# Patient Record
Sex: Male | Born: 1935 | Race: White | Hispanic: No | Marital: Married | Smoking: Never smoker
Health system: Southern US, Community
[De-identification: ages and names within clinical notes are randomized; demographics above are authoritative.]

## PROBLEM LIST (undated history)

## (undated) DIAGNOSIS — M4800 Spinal stenosis, site unspecified: Secondary | ICD-10-CM

## (undated) DIAGNOSIS — I10 Essential (primary) hypertension: Secondary | ICD-10-CM

## (undated) DIAGNOSIS — S129XXA Fracture of neck, unspecified, initial encounter: Secondary | ICD-10-CM

## (undated) DIAGNOSIS — I493 Ventricular premature depolarization: Secondary | ICD-10-CM

## (undated) DIAGNOSIS — K589 Irritable bowel syndrome without diarrhea: Secondary | ICD-10-CM

## (undated) DIAGNOSIS — I480 Paroxysmal atrial fibrillation: Secondary | ICD-10-CM

## (undated) DIAGNOSIS — I495 Sick sinus syndrome: Secondary | ICD-10-CM

## (undated) DIAGNOSIS — R079 Chest pain, unspecified: Secondary | ICD-10-CM

## (undated) DIAGNOSIS — I351 Nonrheumatic aortic (valve) insufficiency: Secondary | ICD-10-CM

## (undated) DIAGNOSIS — I340 Nonrheumatic mitral (valve) insufficiency: Secondary | ICD-10-CM

## (undated) DIAGNOSIS — I4891 Unspecified atrial fibrillation: Secondary | ICD-10-CM

## (undated) DIAGNOSIS — E782 Mixed hyperlipidemia: Secondary | ICD-10-CM

## (undated) DIAGNOSIS — K635 Polyp of colon: Secondary | ICD-10-CM

## (undated) DIAGNOSIS — F419 Anxiety disorder, unspecified: Secondary | ICD-10-CM

## (undated) DIAGNOSIS — H9319 Tinnitus, unspecified ear: Secondary | ICD-10-CM

## (undated) HISTORY — DX: Irritable bowel syndrome, unspecified: K58.9

## (undated) HISTORY — DX: Mixed hyperlipidemia: E78.2

## (undated) HISTORY — PX: COLONOSCOPY: SHX174

## (undated) HISTORY — DX: Nonrheumatic mitral (valve) insufficiency: I34.0

## (undated) HISTORY — DX: Ventricular premature depolarization: I49.3

## (undated) HISTORY — DX: Fracture of neck, unspecified, initial encounter: S12.9XXA

## (undated) HISTORY — PX: HERNIA REPAIR: SHX51

## (undated) HISTORY — DX: Spinal stenosis, site unspecified: M48.00

## (undated) HISTORY — DX: Chest pain, unspecified: R07.9

## (undated) HISTORY — DX: Unspecified atrial fibrillation: I48.91

## (undated) HISTORY — DX: Polyp of colon: K63.5

## (undated) HISTORY — DX: Sick sinus syndrome: I49.5

## (undated) HISTORY — DX: Paroxysmal atrial fibrillation: I48.0

## (undated) HISTORY — DX: Nonrheumatic aortic (valve) insufficiency: I35.1

## (undated) HISTORY — DX: Essential (primary) hypertension: I10

## (undated) HISTORY — DX: Tinnitus, unspecified ear: H93.19

## (undated) HISTORY — DX: Anxiety disorder, unspecified: F41.9

---

## 2003-11-19 ENCOUNTER — Ambulatory Visit: Payer: Self-pay | Admitting: Family Medicine

## 2003-11-26 ENCOUNTER — Ambulatory Visit: Payer: Self-pay | Admitting: Family Medicine

## 2003-12-03 ENCOUNTER — Ambulatory Visit: Payer: Self-pay | Admitting: Family Medicine

## 2003-12-16 ENCOUNTER — Ambulatory Visit: Payer: Self-pay | Admitting: Family Medicine

## 2003-12-29 ENCOUNTER — Ambulatory Visit: Payer: Self-pay | Admitting: Family Medicine

## 2004-01-01 ENCOUNTER — Ambulatory Visit: Payer: Self-pay | Admitting: Cardiology

## 2004-01-01 ENCOUNTER — Ambulatory Visit: Payer: Self-pay

## 2004-01-04 ENCOUNTER — Ambulatory Visit: Payer: Self-pay | Admitting: Family Medicine

## 2004-01-13 ENCOUNTER — Ambulatory Visit: Payer: Self-pay | Admitting: Family Medicine

## 2004-01-21 ENCOUNTER — Ambulatory Visit: Payer: Self-pay | Admitting: Family Medicine

## 2004-01-28 ENCOUNTER — Ambulatory Visit: Payer: Self-pay | Admitting: Family Medicine

## 2004-02-05 ENCOUNTER — Ambulatory Visit (HOSPITAL_COMMUNITY): Admission: RE | Admit: 2004-02-05 | Discharge: 2004-02-05 | Payer: Self-pay | Admitting: Cardiology

## 2004-02-08 ENCOUNTER — Ambulatory Visit: Payer: Self-pay | Admitting: Family Medicine

## 2004-02-11 ENCOUNTER — Ambulatory Visit: Payer: Self-pay | Admitting: Cardiology

## 2004-02-12 ENCOUNTER — Ambulatory Visit (HOSPITAL_COMMUNITY): Admission: RE | Admit: 2004-02-12 | Discharge: 2004-02-12 | Payer: Self-pay | Admitting: Cardiology

## 2004-02-15 ENCOUNTER — Ambulatory Visit: Payer: Self-pay | Admitting: Family Medicine

## 2004-02-18 ENCOUNTER — Ambulatory Visit: Payer: Self-pay | Admitting: Family Medicine

## 2004-02-19 ENCOUNTER — Ambulatory Visit: Payer: Self-pay

## 2004-02-23 ENCOUNTER — Ambulatory Visit: Payer: Self-pay | Admitting: Cardiology

## 2004-02-26 ENCOUNTER — Ambulatory Visit (HOSPITAL_COMMUNITY): Admission: RE | Admit: 2004-02-26 | Discharge: 2004-02-26 | Payer: Self-pay | Admitting: Cardiology

## 2004-02-26 ENCOUNTER — Ambulatory Visit: Payer: Self-pay | Admitting: Cardiology

## 2004-02-29 ENCOUNTER — Ambulatory Visit: Payer: Self-pay | Admitting: Family Medicine

## 2004-03-03 ENCOUNTER — Ambulatory Visit: Payer: Self-pay | Admitting: Family Medicine

## 2004-03-07 ENCOUNTER — Ambulatory Visit: Payer: Self-pay | Admitting: Family Medicine

## 2004-03-11 ENCOUNTER — Ambulatory Visit: Payer: Self-pay | Admitting: Family Medicine

## 2004-03-14 ENCOUNTER — Ambulatory Visit: Payer: Self-pay | Admitting: Family Medicine

## 2004-03-17 ENCOUNTER — Ambulatory Visit: Payer: Self-pay | Admitting: Family Medicine

## 2004-03-21 ENCOUNTER — Ambulatory Visit: Payer: Self-pay | Admitting: Family Medicine

## 2004-03-25 ENCOUNTER — Ambulatory Visit: Payer: Self-pay | Admitting: Family Medicine

## 2004-03-28 ENCOUNTER — Ambulatory Visit: Payer: Self-pay | Admitting: Cardiology

## 2004-03-31 ENCOUNTER — Ambulatory Visit: Payer: Self-pay | Admitting: Family Medicine

## 2004-04-04 ENCOUNTER — Ambulatory Visit: Payer: Self-pay | Admitting: Family Medicine

## 2004-04-07 ENCOUNTER — Ambulatory Visit: Payer: Self-pay | Admitting: Cardiology

## 2004-04-07 ENCOUNTER — Ambulatory Visit: Payer: Self-pay

## 2004-04-11 ENCOUNTER — Ambulatory Visit: Payer: Self-pay | Admitting: Family Medicine

## 2004-04-15 ENCOUNTER — Ambulatory Visit: Payer: Self-pay | Admitting: Family Medicine

## 2004-04-18 ENCOUNTER — Ambulatory Visit: Payer: Self-pay | Admitting: Family Medicine

## 2004-04-28 ENCOUNTER — Ambulatory Visit: Payer: Self-pay | Admitting: Family Medicine

## 2004-05-04 ENCOUNTER — Ambulatory Visit: Payer: Self-pay | Admitting: Family Medicine

## 2004-05-11 ENCOUNTER — Ambulatory Visit: Payer: Self-pay | Admitting: Family Medicine

## 2004-05-27 ENCOUNTER — Ambulatory Visit: Payer: Self-pay | Admitting: Family Medicine

## 2004-06-09 ENCOUNTER — Ambulatory Visit: Payer: Self-pay | Admitting: Family Medicine

## 2004-06-27 ENCOUNTER — Ambulatory Visit: Payer: Self-pay | Admitting: Family Medicine

## 2004-07-04 ENCOUNTER — Ambulatory Visit: Payer: Self-pay | Admitting: Cardiology

## 2004-10-27 ENCOUNTER — Ambulatory Visit: Payer: Self-pay | Admitting: Family Medicine

## 2004-12-06 ENCOUNTER — Ambulatory Visit: Payer: Self-pay | Admitting: Cardiology

## 2005-01-27 ENCOUNTER — Ambulatory Visit: Payer: Self-pay | Admitting: Family Medicine

## 2005-02-23 ENCOUNTER — Ambulatory Visit: Admission: RE | Admit: 2005-02-23 | Discharge: 2005-02-23 | Payer: Self-pay | Admitting: Cardiology

## 2008-09-22 ENCOUNTER — Telehealth (INDEPENDENT_AMBULATORY_CARE_PROVIDER_SITE_OTHER): Payer: Self-pay | Admitting: *Deleted

## 2008-09-23 ENCOUNTER — Ambulatory Visit: Payer: Self-pay | Admitting: Cardiovascular Disease

## 2010-02-21 ENCOUNTER — Ambulatory Visit (INDEPENDENT_AMBULATORY_CARE_PROVIDER_SITE_OTHER): Payer: Medicare Other | Admitting: Cardiovascular Disease

## 2010-02-21 DIAGNOSIS — I499 Cardiac arrhythmia, unspecified: Secondary | ICD-10-CM

## 2010-05-23 ENCOUNTER — Ambulatory Visit: Payer: Medicare Other | Admitting: Cardiovascular Disease

## 2010-05-31 NOTE — Letter (Signed)
September 23, 2008    Elease Hashimoto A. Benedetto Goad, M.D.  [address]  [place]  [place] [address]   RE:  [name], [name]  MRN:  [id number]  /  DOB:  [birth year]   Dear Dr. Benedetto Goad:   I am writing to you regarding your patient, Keith Mccall, whom I saw in  Cardiology Clinic today.  As you know, he is a 77 year old white male  with a history of atrial fibrillation.  He presented to my office today  in order to establish cardiovascular care as his prior cardiologist, Dr.  Shelva Majestic, has relocated.  Keith Mccall is experiencing very infrequent  palpitations and it is not entirely clear whether these are secondary to  atrial fibrillation or simply premature atrial or ventricular  contractions.  I suggested that he wear a cardiac event monitor at home  in order to assess whether he actually is having episodes of atrial  fibrillation, however, the patient declines this at this point secondary  to issues with anxiety.  He is willing to undergo a treadmill EKG which  we will arrange in order to see if exercise elicits any atrial  fibrillation.  He is in normal sinus rhythm today with a normal  cardiovascular exam.  From a medication standpoint, we did recommend  that he reinstitute therapy with aspirin on a daily basis as if he is  having any episodes of atrial fibrillation this will decrease in his  risk for stroke.   I hope you agree with this approach and I look forward to following this  gentleman along with you.  Please feel free to contact our office at any  time.   Sincerely,    Sincerely,      Brayton El, MD  Electronically Signed    SGA/MedQ  DD: 09/23/2008  DT: 09/23/2008  Job #: [id number]

## 2010-05-31 NOTE — Assessment & Plan Note (Signed)
[facility]                        [place] CARDIOLOGY OFFICE NOTE   Keith Mccall, Keith Mccall                        MRN:          [id number]  DATE:09/23/2008                            DOB:          12-15-1930    CHIEF COMPLAINT:  Establishing cardiovascular care.   HISTORY OF PRESENT ILLNESS:  Keith Mccall is a 77 year old white male,  past medical history significant for atrial fibrillation and anxiety who  is presenting to establish cardiovascular care.  The patient states he  has been followed by Dr. Benedetto Goad, his primary care physician, and Dr.  Shelva Majestic, his previous cardiologist.  However, Dr. Shelva Majestic recently moved  out of [place].  The patient states that his cardiovascular history  only includes a couple of episodes of atrial fibrillation.  The first of  which was in 2005, at which time, he was admitted to [facility]  and spontaneously converted to normal sinus rhythm.  The patient had  return of his atrial fibrillation within the next year, at which time he  was started on amiodarone and cardioverted back to normal sinus rhythm.  The patient was on amiodarone for several years at a very low dose.  However, this medication was stopped in the past year as the patient's  ophthalmologist told him that he was beginning to see crystals during  his opthomologic examform and recommended that this medication be  discontinued.  The patient states that he gets palpitations, but very  infrequently.  He states he has not had any at all in the past 3 weeks.  When he does experience palpitations they last less than half an hour  and are self-limiting.  He checks his pulse and his blood pressure at  home occasionally.  He denies any dizziness or syncopal episodes.  He  denies any episodes of chest discomfort.  His and his wife occasionally  takes walk, but have been doing so less frequently secondary to the  heat.  He is able to walk 20-25 minutes with only  mild shortness of  breath.  He states the dyspnea has been present for the past several  years without change.  He denies any lower extremity edema, PND, or  orthopnea.   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  He saw Allena Napoleon, an integrative medicine doctor  who performed chelation therapy on him for this.  She also started him  on variety of supplements.   FAMILY HISTORY:  Positive for extensive coronary artery disease.   SOCIAL HISTORY:  No alcohol.  No tobacco.   ALLERGIES:  PENICILLIN and SULFA both cause hives.   MEDICATIONS:  1. Metoprolol 12.5 mg p.o. b.i.d.  2. Magnesium 200 mg 3 tablets daily.  3. Vitamin D3, 5000 international units daily.  4. Taurine 2000 mg daily.  5. Hawthorn extract 500 mg every evening.  6. Ubiquinone 50 mg every day.  7. Probiotics, Dairy Digest.  8. Veggie enzymes.  9. High Potency Omega-3  10.Fitness tablets.  11.Nature's pearl muscadine grape seed supplement.  12.The patient also takes Xanax p.r.n.   REVIEW OF SYSTEMS:  As in HPI, in addition, the patient states he  suffers from anxiety.  He tends to have ruminative thoughts and states  that it is very easy to make him feel concerned about issues that are  not necessarily objectively concerning.  Other systems reviewed as in  HPI, otherwise negative.  The patient complains of a long-term history  of tinnitus.   PHYSICAL EXAMINATION:  VITAL SIGNS:  Blood pressure is 155/89, pulse 73,  weight 241 pounds, and sating 95% on room air.  GENERAL:  He is in no acute distress.  HEENT:  Nonfocal.  Cranial nerves II through XII grossly intact.  NECK:  Supple.  There is no JVD.  There is no carotid bruit.  There is  no lymphadenopathy.  HEART:  Regular rate and rhythm without murmur, rub, or gallop.  LUNGS:  Clear to auscultation bilaterally.  ABDOMEN:  Soft, nontender,  and nondistended.  There are no bruits  audible.  EXTREMITIES:  Without edema.  SKIN:  Warm and dry without rash.  Pulses 2+ bilateral.  Carotid and  radial pulses is palpable bilateral posterior tibial pulses.  NEUROLOGIC:  Nonfocal.  PSYCHIATRIC:  The patient appropriate with normal levels of insight.  MUSCULOSKELETAL:  The patient has 5/5 bilateral upper and lower  extremities strength.   LABORATORY DATA:  EKG from today independently interrupted by myself has  baseline artefact; however, it is normal sinus rhythm with PR interval  of 176 milliseconds and QRS duration of 102 milliseconds, his QTc is  455.  Review of the patient's medical records shows an echo dated September 04, 2007, showed some mild mitral annular calcification and mild mitral  regurgitation.  His ejection fraction was 60%.  His left atrial size was  4.9 cm.   ASSESSMENT:  A 77 year old white male with a history of atrial  fibrillation, who is currently in normal sinus rhythm and not having any  symptoms consistent with atrial fibrillation.  It should be noted that  in the past during all of his doctor visits over the past couple of  years, he has been in normal sinus rhythm.  He was told by his previous  cardiologist that he does not believe that he is experiencing further  episodes of atrial fibrillation.  The patient is currently not taking  aspirin; however, he is not sure why this was stopped.  His wife seems  to think that he may have had some stomach problems in the past, but he  specifically denies any peptic ulcer disease.  The patient is very  anxious about potential of wearing a heart monitor at home to assess for  atrial fibrillation.   PLAN:  We will order a treadmill EKG as the patient does state he gets  some shortness of breath when walking to see if exercise elicits atrial  fibrillation.  The patient is currently declining a cardiac event  monitor at home.  However, we do recommended that the  patient restart  aspirin on a daily basis.  He seems willing to try this.  Regarding the  multiple supplements that he is taking, I told him that the fish oil,  the muscadine grape seed extract, and the multivitamin he is taking are  probably reasonable supplements.  We will leave the continuation of the  others to his discretion; however, we cannot recommend them.  The  patient understands that  he may be experiencing atrial fibrillation  intermittently and that these episodes maybe completely asymptomatic.  It is also possible that some of these episodes may be associated with  rapid ventricular response that may over time lead to worsening of his  ejection fraction.  Unfortunately, the patient is not currently willing  to wear cardiac monitor.  He is instructed to contact our office if he  experiences return of persistent palpitations.  Otherwise, he should  continue on his metoprolol dose above.  We will see him back in clinic  in 3 months' time.     Brayton El, MD  Electronically Signed    SGA/MedQ  DD: 09/23/2008  DT: 09/23/2008  Job #: [id number]

## 2010-09-07 ENCOUNTER — Encounter: Payer: Self-pay | Admitting: Cardiovascular Disease

## 2010-09-15 ENCOUNTER — Encounter: Payer: Self-pay | Admitting: Cardiovascular Disease

## 2011-01-19 DIAGNOSIS — IMO0002 Reserved for concepts with insufficient information to code with codable children: Secondary | ICD-10-CM | POA: Diagnosis not present

## 2011-01-19 DIAGNOSIS — M999 Biomechanical lesion, unspecified: Secondary | ICD-10-CM | POA: Diagnosis not present

## 2011-01-19 DIAGNOSIS — M9981 Other biomechanical lesions of cervical region: Secondary | ICD-10-CM | POA: Diagnosis not present

## 2011-01-31 DIAGNOSIS — IMO0002 Reserved for concepts with insufficient information to code with codable children: Secondary | ICD-10-CM | POA: Diagnosis not present

## 2011-01-31 DIAGNOSIS — M9981 Other biomechanical lesions of cervical region: Secondary | ICD-10-CM | POA: Diagnosis not present

## 2011-01-31 DIAGNOSIS — M999 Biomechanical lesion, unspecified: Secondary | ICD-10-CM | POA: Diagnosis not present

## 2011-02-18 DIAGNOSIS — IMO0002 Reserved for concepts with insufficient information to code with codable children: Secondary | ICD-10-CM | POA: Diagnosis not present

## 2011-02-18 DIAGNOSIS — M9981 Other biomechanical lesions of cervical region: Secondary | ICD-10-CM | POA: Diagnosis not present

## 2011-02-18 DIAGNOSIS — M999 Biomechanical lesion, unspecified: Secondary | ICD-10-CM | POA: Diagnosis not present

## 2011-02-23 DIAGNOSIS — M999 Biomechanical lesion, unspecified: Secondary | ICD-10-CM | POA: Diagnosis not present

## 2011-02-23 DIAGNOSIS — IMO0002 Reserved for concepts with insufficient information to code with codable children: Secondary | ICD-10-CM | POA: Diagnosis not present

## 2011-02-23 DIAGNOSIS — M9981 Other biomechanical lesions of cervical region: Secondary | ICD-10-CM | POA: Diagnosis not present

## 2011-03-09 DIAGNOSIS — IMO0002 Reserved for concepts with insufficient information to code with codable children: Secondary | ICD-10-CM | POA: Diagnosis not present

## 2011-03-09 DIAGNOSIS — M9981 Other biomechanical lesions of cervical region: Secondary | ICD-10-CM | POA: Diagnosis not present

## 2011-03-09 DIAGNOSIS — M999 Biomechanical lesion, unspecified: Secondary | ICD-10-CM | POA: Diagnosis not present

## 2011-03-21 DIAGNOSIS — M999 Biomechanical lesion, unspecified: Secondary | ICD-10-CM | POA: Diagnosis not present

## 2011-03-21 DIAGNOSIS — M9981 Other biomechanical lesions of cervical region: Secondary | ICD-10-CM | POA: Diagnosis not present

## 2011-03-21 DIAGNOSIS — IMO0002 Reserved for concepts with insufficient information to code with codable children: Secondary | ICD-10-CM | POA: Diagnosis not present

## 2011-03-30 DIAGNOSIS — M9981 Other biomechanical lesions of cervical region: Secondary | ICD-10-CM | POA: Diagnosis not present

## 2011-03-30 DIAGNOSIS — M999 Biomechanical lesion, unspecified: Secondary | ICD-10-CM | POA: Diagnosis not present

## 2011-03-30 DIAGNOSIS — IMO0002 Reserved for concepts with insufficient information to code with codable children: Secondary | ICD-10-CM | POA: Diagnosis not present

## 2011-04-11 DIAGNOSIS — M999 Biomechanical lesion, unspecified: Secondary | ICD-10-CM | POA: Diagnosis not present

## 2011-04-11 DIAGNOSIS — IMO0002 Reserved for concepts with insufficient information to code with codable children: Secondary | ICD-10-CM | POA: Diagnosis not present

## 2011-04-11 DIAGNOSIS — M9981 Other biomechanical lesions of cervical region: Secondary | ICD-10-CM | POA: Diagnosis not present

## 2011-04-20 DIAGNOSIS — M9981 Other biomechanical lesions of cervical region: Secondary | ICD-10-CM | POA: Diagnosis not present

## 2011-04-20 DIAGNOSIS — IMO0002 Reserved for concepts with insufficient information to code with codable children: Secondary | ICD-10-CM | POA: Diagnosis not present

## 2011-04-20 DIAGNOSIS — M999 Biomechanical lesion, unspecified: Secondary | ICD-10-CM | POA: Diagnosis not present

## 2011-04-22 DIAGNOSIS — M9981 Other biomechanical lesions of cervical region: Secondary | ICD-10-CM | POA: Diagnosis not present

## 2011-04-22 DIAGNOSIS — M999 Biomechanical lesion, unspecified: Secondary | ICD-10-CM | POA: Diagnosis not present

## 2011-04-22 DIAGNOSIS — IMO0002 Reserved for concepts with insufficient information to code with codable children: Secondary | ICD-10-CM | POA: Diagnosis not present

## 2011-04-24 DIAGNOSIS — E782 Mixed hyperlipidemia: Secondary | ICD-10-CM | POA: Diagnosis not present

## 2011-04-24 DIAGNOSIS — I10 Essential (primary) hypertension: Secondary | ICD-10-CM | POA: Diagnosis not present

## 2011-04-24 DIAGNOSIS — Z79899 Other long term (current) drug therapy: Secondary | ICD-10-CM | POA: Diagnosis not present

## 2011-04-27 DIAGNOSIS — IMO0002 Reserved for concepts with insufficient information to code with codable children: Secondary | ICD-10-CM | POA: Diagnosis not present

## 2011-04-27 DIAGNOSIS — M9981 Other biomechanical lesions of cervical region: Secondary | ICD-10-CM | POA: Diagnosis not present

## 2011-04-27 DIAGNOSIS — M999 Biomechanical lesion, unspecified: Secondary | ICD-10-CM | POA: Diagnosis not present

## 2011-05-04 DIAGNOSIS — H2589 Other age-related cataract: Secondary | ICD-10-CM | POA: Diagnosis not present

## 2011-05-04 DIAGNOSIS — IMO0002 Reserved for concepts with insufficient information to code with codable children: Secondary | ICD-10-CM | POA: Diagnosis not present

## 2011-05-04 DIAGNOSIS — M9981 Other biomechanical lesions of cervical region: Secondary | ICD-10-CM | POA: Diagnosis not present

## 2011-05-04 DIAGNOSIS — M999 Biomechanical lesion, unspecified: Secondary | ICD-10-CM | POA: Diagnosis not present

## 2011-05-09 DIAGNOSIS — IMO0002 Reserved for concepts with insufficient information to code with codable children: Secondary | ICD-10-CM | POA: Diagnosis not present

## 2011-05-09 DIAGNOSIS — M9981 Other biomechanical lesions of cervical region: Secondary | ICD-10-CM | POA: Diagnosis not present

## 2011-05-09 DIAGNOSIS — M999 Biomechanical lesion, unspecified: Secondary | ICD-10-CM | POA: Diagnosis not present

## 2011-05-18 DIAGNOSIS — M999 Biomechanical lesion, unspecified: Secondary | ICD-10-CM | POA: Diagnosis not present

## 2011-05-18 DIAGNOSIS — M9981 Other biomechanical lesions of cervical region: Secondary | ICD-10-CM | POA: Diagnosis not present

## 2011-05-18 DIAGNOSIS — IMO0002 Reserved for concepts with insufficient information to code with codable children: Secondary | ICD-10-CM | POA: Diagnosis not present

## 2011-05-25 DIAGNOSIS — IMO0002 Reserved for concepts with insufficient information to code with codable children: Secondary | ICD-10-CM | POA: Diagnosis not present

## 2011-05-25 DIAGNOSIS — M9981 Other biomechanical lesions of cervical region: Secondary | ICD-10-CM | POA: Diagnosis not present

## 2011-05-25 DIAGNOSIS — M999 Biomechanical lesion, unspecified: Secondary | ICD-10-CM | POA: Diagnosis not present

## 2011-06-06 DIAGNOSIS — M999 Biomechanical lesion, unspecified: Secondary | ICD-10-CM | POA: Diagnosis not present

## 2011-06-06 DIAGNOSIS — M9981 Other biomechanical lesions of cervical region: Secondary | ICD-10-CM | POA: Diagnosis not present

## 2011-06-06 DIAGNOSIS — IMO0002 Reserved for concepts with insufficient information to code with codable children: Secondary | ICD-10-CM | POA: Diagnosis not present

## 2011-06-15 DIAGNOSIS — M999 Biomechanical lesion, unspecified: Secondary | ICD-10-CM | POA: Diagnosis not present

## 2011-06-15 DIAGNOSIS — IMO0002 Reserved for concepts with insufficient information to code with codable children: Secondary | ICD-10-CM | POA: Diagnosis not present

## 2011-06-15 DIAGNOSIS — M9981 Other biomechanical lesions of cervical region: Secondary | ICD-10-CM | POA: Diagnosis not present

## 2011-06-27 DIAGNOSIS — M999 Biomechanical lesion, unspecified: Secondary | ICD-10-CM | POA: Diagnosis not present

## 2011-06-27 DIAGNOSIS — IMO0002 Reserved for concepts with insufficient information to code with codable children: Secondary | ICD-10-CM | POA: Diagnosis not present

## 2011-06-27 DIAGNOSIS — M9981 Other biomechanical lesions of cervical region: Secondary | ICD-10-CM | POA: Diagnosis not present

## 2011-07-04 DIAGNOSIS — M999 Biomechanical lesion, unspecified: Secondary | ICD-10-CM | POA: Diagnosis not present

## 2011-07-04 DIAGNOSIS — M9981 Other biomechanical lesions of cervical region: Secondary | ICD-10-CM | POA: Diagnosis not present

## 2011-07-04 DIAGNOSIS — IMO0002 Reserved for concepts with insufficient information to code with codable children: Secondary | ICD-10-CM | POA: Diagnosis not present

## 2011-07-06 DIAGNOSIS — K589 Irritable bowel syndrome without diarrhea: Secondary | ICD-10-CM | POA: Diagnosis not present

## 2011-07-06 DIAGNOSIS — F411 Generalized anxiety disorder: Secondary | ICD-10-CM | POA: Diagnosis not present

## 2011-08-16 DIAGNOSIS — I4891 Unspecified atrial fibrillation: Secondary | ICD-10-CM | POA: Diagnosis not present

## 2011-08-16 DIAGNOSIS — I10 Essential (primary) hypertension: Secondary | ICD-10-CM | POA: Diagnosis not present

## 2011-08-16 DIAGNOSIS — E782 Mixed hyperlipidemia: Secondary | ICD-10-CM | POA: Diagnosis not present

## 2011-09-12 DIAGNOSIS — F411 Generalized anxiety disorder: Secondary | ICD-10-CM | POA: Diagnosis not present

## 2011-09-12 DIAGNOSIS — E119 Type 2 diabetes mellitus without complications: Secondary | ICD-10-CM | POA: Diagnosis not present

## 2011-09-12 DIAGNOSIS — I10 Essential (primary) hypertension: Secondary | ICD-10-CM | POA: Diagnosis not present

## 2011-12-21 DIAGNOSIS — Z1211 Encounter for screening for malignant neoplasm of colon: Secondary | ICD-10-CM | POA: Diagnosis not present

## 2012-01-03 DIAGNOSIS — E119 Type 2 diabetes mellitus without complications: Secondary | ICD-10-CM | POA: Diagnosis not present

## 2012-01-03 DIAGNOSIS — I10 Essential (primary) hypertension: Secondary | ICD-10-CM | POA: Diagnosis not present

## 2012-01-03 DIAGNOSIS — R634 Abnormal weight loss: Secondary | ICD-10-CM | POA: Diagnosis not present

## 2012-01-03 DIAGNOSIS — K589 Irritable bowel syndrome without diarrhea: Secondary | ICD-10-CM | POA: Diagnosis not present

## 2012-01-03 DIAGNOSIS — F411 Generalized anxiety disorder: Secondary | ICD-10-CM | POA: Diagnosis not present

## 2012-01-30 DIAGNOSIS — M9981 Other biomechanical lesions of cervical region: Secondary | ICD-10-CM | POA: Diagnosis not present

## 2012-01-30 DIAGNOSIS — M999 Biomechanical lesion, unspecified: Secondary | ICD-10-CM | POA: Diagnosis not present

## 2012-01-30 DIAGNOSIS — IMO0002 Reserved for concepts with insufficient information to code with codable children: Secondary | ICD-10-CM | POA: Diagnosis not present

## 2012-02-03 DIAGNOSIS — M9981 Other biomechanical lesions of cervical region: Secondary | ICD-10-CM | POA: Diagnosis not present

## 2012-02-03 DIAGNOSIS — IMO0002 Reserved for concepts with insufficient information to code with codable children: Secondary | ICD-10-CM | POA: Diagnosis not present

## 2012-02-03 DIAGNOSIS — M999 Biomechanical lesion, unspecified: Secondary | ICD-10-CM | POA: Diagnosis not present

## 2012-03-07 DIAGNOSIS — IMO0002 Reserved for concepts with insufficient information to code with codable children: Secondary | ICD-10-CM | POA: Diagnosis not present

## 2012-03-07 DIAGNOSIS — M999 Biomechanical lesion, unspecified: Secondary | ICD-10-CM | POA: Diagnosis not present

## 2012-03-19 DIAGNOSIS — M999 Biomechanical lesion, unspecified: Secondary | ICD-10-CM | POA: Diagnosis not present

## 2012-03-19 DIAGNOSIS — IMO0002 Reserved for concepts with insufficient information to code with codable children: Secondary | ICD-10-CM | POA: Diagnosis not present

## 2012-04-06 DIAGNOSIS — M999 Biomechanical lesion, unspecified: Secondary | ICD-10-CM | POA: Diagnosis not present

## 2012-04-06 DIAGNOSIS — IMO0002 Reserved for concepts with insufficient information to code with codable children: Secondary | ICD-10-CM | POA: Diagnosis not present

## 2012-04-15 DIAGNOSIS — K140 Glossitis: Secondary | ICD-10-CM | POA: Diagnosis not present

## 2012-04-25 DIAGNOSIS — IMO0002 Reserved for concepts with insufficient information to code with codable children: Secondary | ICD-10-CM | POA: Diagnosis not present

## 2012-04-25 DIAGNOSIS — M999 Biomechanical lesion, unspecified: Secondary | ICD-10-CM | POA: Diagnosis not present

## 2012-05-02 DIAGNOSIS — IMO0002 Reserved for concepts with insufficient information to code with codable children: Secondary | ICD-10-CM | POA: Diagnosis not present

## 2012-05-02 DIAGNOSIS — M999 Biomechanical lesion, unspecified: Secondary | ICD-10-CM | POA: Diagnosis not present

## 2012-05-06 DIAGNOSIS — H2589 Other age-related cataract: Secondary | ICD-10-CM | POA: Diagnosis not present

## 2012-05-07 DIAGNOSIS — M999 Biomechanical lesion, unspecified: Secondary | ICD-10-CM | POA: Diagnosis not present

## 2012-05-07 DIAGNOSIS — IMO0002 Reserved for concepts with insufficient information to code with codable children: Secondary | ICD-10-CM | POA: Diagnosis not present

## 2012-05-16 DIAGNOSIS — IMO0002 Reserved for concepts with insufficient information to code with codable children: Secondary | ICD-10-CM | POA: Diagnosis not present

## 2012-05-16 DIAGNOSIS — M999 Biomechanical lesion, unspecified: Secondary | ICD-10-CM | POA: Diagnosis not present

## 2012-05-23 DIAGNOSIS — M999 Biomechanical lesion, unspecified: Secondary | ICD-10-CM | POA: Diagnosis not present

## 2012-05-23 DIAGNOSIS — IMO0002 Reserved for concepts with insufficient information to code with codable children: Secondary | ICD-10-CM | POA: Diagnosis not present

## 2012-05-30 DIAGNOSIS — IMO0002 Reserved for concepts with insufficient information to code with codable children: Secondary | ICD-10-CM | POA: Diagnosis not present

## 2012-05-30 DIAGNOSIS — M999 Biomechanical lesion, unspecified: Secondary | ICD-10-CM | POA: Diagnosis not present

## 2012-06-11 DIAGNOSIS — IMO0002 Reserved for concepts with insufficient information to code with codable children: Secondary | ICD-10-CM | POA: Diagnosis not present

## 2012-06-11 DIAGNOSIS — M999 Biomechanical lesion, unspecified: Secondary | ICD-10-CM | POA: Diagnosis not present

## 2012-06-14 DIAGNOSIS — R42 Dizziness and giddiness: Secondary | ICD-10-CM | POA: Diagnosis not present

## 2012-06-14 DIAGNOSIS — Z0000 Encounter for general adult medical examination without abnormal findings: Secondary | ICD-10-CM | POA: Diagnosis not present

## 2012-06-14 DIAGNOSIS — F411 Generalized anxiety disorder: Secondary | ICD-10-CM | POA: Diagnosis not present

## 2012-06-14 DIAGNOSIS — R7989 Other specified abnormal findings of blood chemistry: Secondary | ICD-10-CM | POA: Diagnosis not present

## 2012-06-18 DIAGNOSIS — M999 Biomechanical lesion, unspecified: Secondary | ICD-10-CM | POA: Diagnosis not present

## 2012-06-18 DIAGNOSIS — IMO0002 Reserved for concepts with insufficient information to code with codable children: Secondary | ICD-10-CM | POA: Diagnosis not present

## 2012-06-20 DIAGNOSIS — I6529 Occlusion and stenosis of unspecified carotid artery: Secondary | ICD-10-CM | POA: Diagnosis not present

## 2012-06-20 DIAGNOSIS — R42 Dizziness and giddiness: Secondary | ICD-10-CM | POA: Diagnosis not present

## 2012-06-20 DIAGNOSIS — I658 Occlusion and stenosis of other precerebral arteries: Secondary | ICD-10-CM | POA: Diagnosis not present

## 2012-06-20 DIAGNOSIS — I359 Nonrheumatic aortic valve disorder, unspecified: Secondary | ICD-10-CM | POA: Diagnosis not present

## 2012-07-03 DIAGNOSIS — I10 Essential (primary) hypertension: Secondary | ICD-10-CM | POA: Diagnosis not present

## 2012-07-03 DIAGNOSIS — F411 Generalized anxiety disorder: Secondary | ICD-10-CM | POA: Diagnosis not present

## 2012-07-03 DIAGNOSIS — E119 Type 2 diabetes mellitus without complications: Secondary | ICD-10-CM | POA: Diagnosis not present

## 2012-07-03 DIAGNOSIS — I672 Cerebral atherosclerosis: Secondary | ICD-10-CM | POA: Diagnosis not present

## 2012-07-04 DIAGNOSIS — M999 Biomechanical lesion, unspecified: Secondary | ICD-10-CM | POA: Diagnosis not present

## 2012-07-04 DIAGNOSIS — IMO0002 Reserved for concepts with insufficient information to code with codable children: Secondary | ICD-10-CM | POA: Diagnosis not present

## 2012-07-18 DIAGNOSIS — IMO0002 Reserved for concepts with insufficient information to code with codable children: Secondary | ICD-10-CM | POA: Diagnosis not present

## 2012-07-18 DIAGNOSIS — M999 Biomechanical lesion, unspecified: Secondary | ICD-10-CM | POA: Diagnosis not present

## 2012-07-25 DIAGNOSIS — IMO0002 Reserved for concepts with insufficient information to code with codable children: Secondary | ICD-10-CM | POA: Diagnosis not present

## 2012-07-25 DIAGNOSIS — M999 Biomechanical lesion, unspecified: Secondary | ICD-10-CM | POA: Diagnosis not present

## 2012-08-08 DIAGNOSIS — M999 Biomechanical lesion, unspecified: Secondary | ICD-10-CM | POA: Diagnosis not present

## 2012-08-08 DIAGNOSIS — IMO0002 Reserved for concepts with insufficient information to code with codable children: Secondary | ICD-10-CM | POA: Diagnosis not present

## 2012-08-22 DIAGNOSIS — IMO0002 Reserved for concepts with insufficient information to code with codable children: Secondary | ICD-10-CM | POA: Diagnosis not present

## 2012-08-22 DIAGNOSIS — M999 Biomechanical lesion, unspecified: Secondary | ICD-10-CM | POA: Diagnosis not present

## 2012-08-31 DIAGNOSIS — M999 Biomechanical lesion, unspecified: Secondary | ICD-10-CM | POA: Diagnosis not present

## 2012-08-31 DIAGNOSIS — IMO0002 Reserved for concepts with insufficient information to code with codable children: Secondary | ICD-10-CM | POA: Diagnosis not present

## 2012-09-05 DIAGNOSIS — IMO0002 Reserved for concepts with insufficient information to code with codable children: Secondary | ICD-10-CM | POA: Diagnosis not present

## 2012-09-05 DIAGNOSIS — M999 Biomechanical lesion, unspecified: Secondary | ICD-10-CM | POA: Diagnosis not present

## 2012-09-12 DIAGNOSIS — M999 Biomechanical lesion, unspecified: Secondary | ICD-10-CM | POA: Diagnosis not present

## 2012-09-12 DIAGNOSIS — IMO0002 Reserved for concepts with insufficient information to code with codable children: Secondary | ICD-10-CM | POA: Diagnosis not present

## 2012-09-19 DIAGNOSIS — IMO0002 Reserved for concepts with insufficient information to code with codable children: Secondary | ICD-10-CM | POA: Diagnosis not present

## 2012-09-19 DIAGNOSIS — M999 Biomechanical lesion, unspecified: Secondary | ICD-10-CM | POA: Diagnosis not present

## 2012-09-24 DIAGNOSIS — M999 Biomechanical lesion, unspecified: Secondary | ICD-10-CM | POA: Diagnosis not present

## 2012-09-24 DIAGNOSIS — IMO0002 Reserved for concepts with insufficient information to code with codable children: Secondary | ICD-10-CM | POA: Diagnosis not present

## 2012-10-03 DIAGNOSIS — M999 Biomechanical lesion, unspecified: Secondary | ICD-10-CM | POA: Diagnosis not present

## 2012-10-03 DIAGNOSIS — IMO0002 Reserved for concepts with insufficient information to code with codable children: Secondary | ICD-10-CM | POA: Diagnosis not present

## 2012-10-10 DIAGNOSIS — IMO0002 Reserved for concepts with insufficient information to code with codable children: Secondary | ICD-10-CM | POA: Diagnosis not present

## 2012-10-10 DIAGNOSIS — M999 Biomechanical lesion, unspecified: Secondary | ICD-10-CM | POA: Diagnosis not present

## 2012-10-17 DIAGNOSIS — M999 Biomechanical lesion, unspecified: Secondary | ICD-10-CM | POA: Diagnosis not present

## 2012-10-17 DIAGNOSIS — IMO0002 Reserved for concepts with insufficient information to code with codable children: Secondary | ICD-10-CM | POA: Diagnosis not present

## 2012-11-05 DIAGNOSIS — M999 Biomechanical lesion, unspecified: Secondary | ICD-10-CM | POA: Diagnosis not present

## 2012-11-05 DIAGNOSIS — IMO0002 Reserved for concepts with insufficient information to code with codable children: Secondary | ICD-10-CM | POA: Diagnosis not present

## 2012-11-06 DIAGNOSIS — Z79899 Other long term (current) drug therapy: Secondary | ICD-10-CM | POA: Diagnosis not present

## 2012-11-06 DIAGNOSIS — F411 Generalized anxiety disorder: Secondary | ICD-10-CM | POA: Diagnosis not present

## 2012-11-06 DIAGNOSIS — K1370 Unspecified lesions of oral mucosa: Secondary | ICD-10-CM | POA: Diagnosis not present

## 2012-11-06 DIAGNOSIS — M79609 Pain in unspecified limb: Secondary | ICD-10-CM | POA: Diagnosis not present

## 2012-11-06 DIAGNOSIS — E119 Type 2 diabetes mellitus without complications: Secondary | ICD-10-CM | POA: Diagnosis not present

## 2012-11-06 DIAGNOSIS — I10 Essential (primary) hypertension: Secondary | ICD-10-CM | POA: Diagnosis not present

## 2012-11-06 DIAGNOSIS — E785 Hyperlipidemia, unspecified: Secondary | ICD-10-CM | POA: Diagnosis not present

## 2012-11-14 DIAGNOSIS — IMO0002 Reserved for concepts with insufficient information to code with codable children: Secondary | ICD-10-CM | POA: Diagnosis not present

## 2012-11-14 DIAGNOSIS — M999 Biomechanical lesion, unspecified: Secondary | ICD-10-CM | POA: Diagnosis not present

## 2012-11-20 DIAGNOSIS — M25579 Pain in unspecified ankle and joints of unspecified foot: Secondary | ICD-10-CM | POA: Diagnosis not present

## 2012-11-20 DIAGNOSIS — R269 Unspecified abnormalities of gait and mobility: Secondary | ICD-10-CM | POA: Diagnosis not present

## 2012-11-20 DIAGNOSIS — M545 Low back pain: Secondary | ICD-10-CM | POA: Diagnosis not present

## 2012-11-26 DIAGNOSIS — M545 Low back pain: Secondary | ICD-10-CM | POA: Diagnosis not present

## 2012-11-26 DIAGNOSIS — R269 Unspecified abnormalities of gait and mobility: Secondary | ICD-10-CM | POA: Diagnosis not present

## 2012-11-26 DIAGNOSIS — M25579 Pain in unspecified ankle and joints of unspecified foot: Secondary | ICD-10-CM | POA: Diagnosis not present

## 2012-11-29 DIAGNOSIS — M25579 Pain in unspecified ankle and joints of unspecified foot: Secondary | ICD-10-CM | POA: Diagnosis not present

## 2012-11-29 DIAGNOSIS — R269 Unspecified abnormalities of gait and mobility: Secondary | ICD-10-CM | POA: Diagnosis not present

## 2012-11-29 DIAGNOSIS — M545 Low back pain: Secondary | ICD-10-CM | POA: Diagnosis not present

## 2012-12-06 DIAGNOSIS — M545 Low back pain: Secondary | ICD-10-CM | POA: Diagnosis not present

## 2012-12-06 DIAGNOSIS — R269 Unspecified abnormalities of gait and mobility: Secondary | ICD-10-CM | POA: Diagnosis not present

## 2012-12-06 DIAGNOSIS — M25579 Pain in unspecified ankle and joints of unspecified foot: Secondary | ICD-10-CM | POA: Diagnosis not present

## 2012-12-17 DIAGNOSIS — M545 Low back pain: Secondary | ICD-10-CM | POA: Diagnosis not present

## 2012-12-17 DIAGNOSIS — R269 Unspecified abnormalities of gait and mobility: Secondary | ICD-10-CM | POA: Diagnosis not present

## 2012-12-17 DIAGNOSIS — M25579 Pain in unspecified ankle and joints of unspecified foot: Secondary | ICD-10-CM | POA: Diagnosis not present

## 2012-12-18 DIAGNOSIS — M999 Biomechanical lesion, unspecified: Secondary | ICD-10-CM | POA: Diagnosis not present

## 2012-12-18 DIAGNOSIS — IMO0002 Reserved for concepts with insufficient information to code with codable children: Secondary | ICD-10-CM | POA: Diagnosis not present

## 2012-12-25 DIAGNOSIS — R269 Unspecified abnormalities of gait and mobility: Secondary | ICD-10-CM | POA: Diagnosis not present

## 2012-12-25 DIAGNOSIS — M545 Low back pain: Secondary | ICD-10-CM | POA: Diagnosis not present

## 2012-12-25 DIAGNOSIS — M25579 Pain in unspecified ankle and joints of unspecified foot: Secondary | ICD-10-CM | POA: Diagnosis not present

## 2012-12-31 DIAGNOSIS — M545 Low back pain: Secondary | ICD-10-CM | POA: Diagnosis not present

## 2012-12-31 DIAGNOSIS — R269 Unspecified abnormalities of gait and mobility: Secondary | ICD-10-CM | POA: Diagnosis not present

## 2012-12-31 DIAGNOSIS — M25579 Pain in unspecified ankle and joints of unspecified foot: Secondary | ICD-10-CM | POA: Diagnosis not present

## 2013-01-07 DIAGNOSIS — R269 Unspecified abnormalities of gait and mobility: Secondary | ICD-10-CM | POA: Diagnosis not present

## 2013-01-07 DIAGNOSIS — M25579 Pain in unspecified ankle and joints of unspecified foot: Secondary | ICD-10-CM | POA: Diagnosis not present

## 2013-01-07 DIAGNOSIS — M545 Low back pain: Secondary | ICD-10-CM | POA: Diagnosis not present

## 2013-01-15 DIAGNOSIS — IMO0002 Reserved for concepts with insufficient information to code with codable children: Secondary | ICD-10-CM | POA: Diagnosis not present

## 2013-01-15 DIAGNOSIS — M999 Biomechanical lesion, unspecified: Secondary | ICD-10-CM | POA: Diagnosis not present

## 2013-01-21 DIAGNOSIS — M25579 Pain in unspecified ankle and joints of unspecified foot: Secondary | ICD-10-CM | POA: Diagnosis not present

## 2013-01-21 DIAGNOSIS — R269 Unspecified abnormalities of gait and mobility: Secondary | ICD-10-CM | POA: Diagnosis not present

## 2013-01-21 DIAGNOSIS — M545 Low back pain, unspecified: Secondary | ICD-10-CM | POA: Diagnosis not present

## 2013-01-23 DIAGNOSIS — M999 Biomechanical lesion, unspecified: Secondary | ICD-10-CM | POA: Diagnosis not present

## 2013-01-23 DIAGNOSIS — IMO0002 Reserved for concepts with insufficient information to code with codable children: Secondary | ICD-10-CM | POA: Diagnosis not present

## 2013-01-24 DIAGNOSIS — R269 Unspecified abnormalities of gait and mobility: Secondary | ICD-10-CM | POA: Diagnosis not present

## 2013-01-24 DIAGNOSIS — M545 Low back pain, unspecified: Secondary | ICD-10-CM | POA: Diagnosis not present

## 2013-01-24 DIAGNOSIS — M25579 Pain in unspecified ankle and joints of unspecified foot: Secondary | ICD-10-CM | POA: Diagnosis not present

## 2013-01-28 DIAGNOSIS — R269 Unspecified abnormalities of gait and mobility: Secondary | ICD-10-CM | POA: Diagnosis not present

## 2013-01-28 DIAGNOSIS — M545 Low back pain, unspecified: Secondary | ICD-10-CM | POA: Diagnosis not present

## 2013-01-28 DIAGNOSIS — M25579 Pain in unspecified ankle and joints of unspecified foot: Secondary | ICD-10-CM | POA: Diagnosis not present

## 2013-02-05 DIAGNOSIS — R269 Unspecified abnormalities of gait and mobility: Secondary | ICD-10-CM | POA: Diagnosis not present

## 2013-02-05 DIAGNOSIS — M545 Low back pain, unspecified: Secondary | ICD-10-CM | POA: Diagnosis not present

## 2013-02-05 DIAGNOSIS — M25579 Pain in unspecified ankle and joints of unspecified foot: Secondary | ICD-10-CM | POA: Diagnosis not present

## 2013-02-07 DIAGNOSIS — R269 Unspecified abnormalities of gait and mobility: Secondary | ICD-10-CM | POA: Diagnosis not present

## 2013-02-07 DIAGNOSIS — M545 Low back pain, unspecified: Secondary | ICD-10-CM | POA: Diagnosis not present

## 2013-02-07 DIAGNOSIS — M25579 Pain in unspecified ankle and joints of unspecified foot: Secondary | ICD-10-CM | POA: Diagnosis not present

## 2013-02-10 DIAGNOSIS — R269 Unspecified abnormalities of gait and mobility: Secondary | ICD-10-CM | POA: Diagnosis not present

## 2013-02-10 DIAGNOSIS — M25579 Pain in unspecified ankle and joints of unspecified foot: Secondary | ICD-10-CM | POA: Diagnosis not present

## 2013-02-10 DIAGNOSIS — M545 Low back pain, unspecified: Secondary | ICD-10-CM | POA: Diagnosis not present

## 2013-02-12 DIAGNOSIS — M999 Biomechanical lesion, unspecified: Secondary | ICD-10-CM | POA: Diagnosis not present

## 2013-02-12 DIAGNOSIS — IMO0002 Reserved for concepts with insufficient information to code with codable children: Secondary | ICD-10-CM | POA: Diagnosis not present

## 2013-02-27 DIAGNOSIS — H1130 Conjunctival hemorrhage, unspecified eye: Secondary | ICD-10-CM | POA: Diagnosis not present

## 2013-03-12 DIAGNOSIS — E785 Hyperlipidemia, unspecified: Secondary | ICD-10-CM | POA: Diagnosis not present

## 2013-03-12 DIAGNOSIS — F411 Generalized anxiety disorder: Secondary | ICD-10-CM | POA: Diagnosis not present

## 2013-03-12 DIAGNOSIS — K589 Irritable bowel syndrome without diarrhea: Secondary | ICD-10-CM | POA: Diagnosis not present

## 2013-03-12 DIAGNOSIS — I10 Essential (primary) hypertension: Secondary | ICD-10-CM | POA: Diagnosis not present

## 2013-03-12 DIAGNOSIS — E119 Type 2 diabetes mellitus without complications: Secondary | ICD-10-CM | POA: Diagnosis not present

## 2013-03-18 DIAGNOSIS — IMO0002 Reserved for concepts with insufficient information to code with codable children: Secondary | ICD-10-CM | POA: Diagnosis not present

## 2013-03-18 DIAGNOSIS — M999 Biomechanical lesion, unspecified: Secondary | ICD-10-CM | POA: Diagnosis not present

## 2013-03-27 DIAGNOSIS — Z1211 Encounter for screening for malignant neoplasm of colon: Secondary | ICD-10-CM | POA: Diagnosis not present

## 2013-04-04 DIAGNOSIS — E119 Type 2 diabetes mellitus without complications: Secondary | ICD-10-CM | POA: Diagnosis not present

## 2013-04-04 DIAGNOSIS — I10 Essential (primary) hypertension: Secondary | ICD-10-CM | POA: Diagnosis not present

## 2013-04-09 DIAGNOSIS — IMO0002 Reserved for concepts with insufficient information to code with codable children: Secondary | ICD-10-CM | POA: Diagnosis not present

## 2013-04-09 DIAGNOSIS — M999 Biomechanical lesion, unspecified: Secondary | ICD-10-CM | POA: Diagnosis not present

## 2013-04-16 DIAGNOSIS — IMO0002 Reserved for concepts with insufficient information to code with codable children: Secondary | ICD-10-CM | POA: Diagnosis not present

## 2013-04-16 DIAGNOSIS — M999 Biomechanical lesion, unspecified: Secondary | ICD-10-CM | POA: Diagnosis not present

## 2013-05-06 DIAGNOSIS — M999 Biomechanical lesion, unspecified: Secondary | ICD-10-CM | POA: Diagnosis not present

## 2013-05-06 DIAGNOSIS — IMO0002 Reserved for concepts with insufficient information to code with codable children: Secondary | ICD-10-CM | POA: Diagnosis not present

## 2013-05-09 DIAGNOSIS — H2589 Other age-related cataract: Secondary | ICD-10-CM | POA: Diagnosis not present

## 2013-05-27 DIAGNOSIS — IMO0002 Reserved for concepts with insufficient information to code with codable children: Secondary | ICD-10-CM | POA: Diagnosis not present

## 2013-05-27 DIAGNOSIS — M999 Biomechanical lesion, unspecified: Secondary | ICD-10-CM | POA: Diagnosis not present

## 2013-06-02 DIAGNOSIS — R109 Unspecified abdominal pain: Secondary | ICD-10-CM | POA: Diagnosis not present

## 2013-06-02 DIAGNOSIS — M48061 Spinal stenosis, lumbar region without neurogenic claudication: Secondary | ICD-10-CM | POA: Diagnosis not present

## 2013-06-17 DIAGNOSIS — M999 Biomechanical lesion, unspecified: Secondary | ICD-10-CM | POA: Diagnosis not present

## 2013-06-17 DIAGNOSIS — IMO0002 Reserved for concepts with insufficient information to code with codable children: Secondary | ICD-10-CM | POA: Diagnosis not present

## 2013-07-10 DIAGNOSIS — E119 Type 2 diabetes mellitus without complications: Secondary | ICD-10-CM | POA: Diagnosis not present

## 2013-07-10 DIAGNOSIS — F411 Generalized anxiety disorder: Secondary | ICD-10-CM | POA: Diagnosis not present

## 2013-07-10 DIAGNOSIS — I10 Essential (primary) hypertension: Secondary | ICD-10-CM | POA: Diagnosis not present

## 2013-07-15 DIAGNOSIS — M999 Biomechanical lesion, unspecified: Secondary | ICD-10-CM | POA: Diagnosis not present

## 2013-07-15 DIAGNOSIS — IMO0002 Reserved for concepts with insufficient information to code with codable children: Secondary | ICD-10-CM | POA: Diagnosis not present

## 2013-07-28 DIAGNOSIS — E119 Type 2 diabetes mellitus without complications: Secondary | ICD-10-CM | POA: Diagnosis not present

## 2013-07-31 DIAGNOSIS — M999 Biomechanical lesion, unspecified: Secondary | ICD-10-CM | POA: Diagnosis not present

## 2013-07-31 DIAGNOSIS — IMO0002 Reserved for concepts with insufficient information to code with codable children: Secondary | ICD-10-CM | POA: Diagnosis not present

## 2013-08-14 DIAGNOSIS — IMO0002 Reserved for concepts with insufficient information to code with codable children: Secondary | ICD-10-CM | POA: Diagnosis not present

## 2013-08-14 DIAGNOSIS — M999 Biomechanical lesion, unspecified: Secondary | ICD-10-CM | POA: Diagnosis not present

## 2013-08-19 DIAGNOSIS — E119 Type 2 diabetes mellitus without complications: Secondary | ICD-10-CM | POA: Diagnosis not present

## 2013-08-26 DIAGNOSIS — M999 Biomechanical lesion, unspecified: Secondary | ICD-10-CM | POA: Diagnosis not present

## 2013-08-26 DIAGNOSIS — IMO0002 Reserved for concepts with insufficient information to code with codable children: Secondary | ICD-10-CM | POA: Diagnosis not present

## 2013-09-01 DIAGNOSIS — E119 Type 2 diabetes mellitus without complications: Secondary | ICD-10-CM | POA: Diagnosis not present

## 2013-09-04 DIAGNOSIS — M999 Biomechanical lesion, unspecified: Secondary | ICD-10-CM | POA: Diagnosis not present

## 2013-09-04 DIAGNOSIS — IMO0002 Reserved for concepts with insufficient information to code with codable children: Secondary | ICD-10-CM | POA: Diagnosis not present

## 2013-09-16 DIAGNOSIS — M999 Biomechanical lesion, unspecified: Secondary | ICD-10-CM | POA: Diagnosis not present

## 2013-09-16 DIAGNOSIS — IMO0002 Reserved for concepts with insufficient information to code with codable children: Secondary | ICD-10-CM | POA: Diagnosis not present

## 2013-09-30 DIAGNOSIS — M999 Biomechanical lesion, unspecified: Secondary | ICD-10-CM | POA: Diagnosis not present

## 2013-09-30 DIAGNOSIS — IMO0002 Reserved for concepts with insufficient information to code with codable children: Secondary | ICD-10-CM | POA: Diagnosis not present

## 2013-10-21 DIAGNOSIS — E119 Type 2 diabetes mellitus without complications: Secondary | ICD-10-CM | POA: Diagnosis not present

## 2013-10-21 DIAGNOSIS — E785 Hyperlipidemia, unspecified: Secondary | ICD-10-CM | POA: Diagnosis not present

## 2013-10-21 DIAGNOSIS — I10 Essential (primary) hypertension: Secondary | ICD-10-CM | POA: Diagnosis not present

## 2013-10-21 DIAGNOSIS — F419 Anxiety disorder, unspecified: Secondary | ICD-10-CM | POA: Diagnosis not present

## 2013-10-21 DIAGNOSIS — M4806 Spinal stenosis, lumbar region: Secondary | ICD-10-CM | POA: Diagnosis not present

## 2013-10-21 DIAGNOSIS — I480 Paroxysmal atrial fibrillation: Secondary | ICD-10-CM | POA: Diagnosis not present

## 2013-10-31 DIAGNOSIS — E785 Hyperlipidemia, unspecified: Secondary | ICD-10-CM | POA: Diagnosis not present

## 2013-10-31 DIAGNOSIS — Z8249 Family history of ischemic heart disease and other diseases of the circulatory system: Secondary | ICD-10-CM | POA: Diagnosis not present

## 2013-10-31 DIAGNOSIS — I10 Essential (primary) hypertension: Secondary | ICD-10-CM | POA: Diagnosis not present

## 2013-10-31 DIAGNOSIS — I493 Ventricular premature depolarization: Secondary | ICD-10-CM | POA: Diagnosis not present

## 2013-10-31 DIAGNOSIS — F411 Generalized anxiety disorder: Secondary | ICD-10-CM | POA: Diagnosis not present

## 2013-10-31 DIAGNOSIS — I480 Paroxysmal atrial fibrillation: Secondary | ICD-10-CM | POA: Diagnosis not present

## 2013-10-31 DIAGNOSIS — Z789 Other specified health status: Secondary | ICD-10-CM | POA: Diagnosis not present

## 2013-11-06 DIAGNOSIS — M5126 Other intervertebral disc displacement, lumbar region: Secondary | ICD-10-CM | POA: Diagnosis not present

## 2013-11-06 DIAGNOSIS — M542 Cervicalgia: Secondary | ICD-10-CM | POA: Diagnosis not present

## 2013-11-06 DIAGNOSIS — M9905 Segmental and somatic dysfunction of pelvic region: Secondary | ICD-10-CM | POA: Diagnosis not present

## 2013-11-06 DIAGNOSIS — M9901 Segmental and somatic dysfunction of cervical region: Secondary | ICD-10-CM | POA: Diagnosis not present

## 2013-11-06 DIAGNOSIS — M9903 Segmental and somatic dysfunction of lumbar region: Secondary | ICD-10-CM | POA: Diagnosis not present

## 2013-11-07 DIAGNOSIS — M4806 Spinal stenosis, lumbar region: Secondary | ICD-10-CM | POA: Diagnosis not present

## 2013-11-07 DIAGNOSIS — R262 Difficulty in walking, not elsewhere classified: Secondary | ICD-10-CM | POA: Diagnosis not present

## 2013-11-07 DIAGNOSIS — M545 Low back pain: Secondary | ICD-10-CM | POA: Diagnosis not present

## 2013-11-13 DIAGNOSIS — I480 Paroxysmal atrial fibrillation: Secondary | ICD-10-CM | POA: Diagnosis not present

## 2013-11-13 DIAGNOSIS — I481 Persistent atrial fibrillation: Secondary | ICD-10-CM | POA: Diagnosis not present

## 2013-11-14 DIAGNOSIS — M4806 Spinal stenosis, lumbar region: Secondary | ICD-10-CM | POA: Diagnosis not present

## 2013-11-14 DIAGNOSIS — R262 Difficulty in walking, not elsewhere classified: Secondary | ICD-10-CM | POA: Diagnosis not present

## 2013-11-14 DIAGNOSIS — M545 Low back pain: Secondary | ICD-10-CM | POA: Diagnosis not present

## 2013-11-17 DIAGNOSIS — I4891 Unspecified atrial fibrillation: Secondary | ICD-10-CM | POA: Diagnosis not present

## 2013-11-18 DIAGNOSIS — R262 Difficulty in walking, not elsewhere classified: Secondary | ICD-10-CM | POA: Diagnosis not present

## 2013-11-18 DIAGNOSIS — M545 Low back pain: Secondary | ICD-10-CM | POA: Diagnosis not present

## 2013-11-18 DIAGNOSIS — M4806 Spinal stenosis, lumbar region: Secondary | ICD-10-CM | POA: Diagnosis not present

## 2013-11-21 DIAGNOSIS — M4806 Spinal stenosis, lumbar region: Secondary | ICD-10-CM | POA: Diagnosis not present

## 2013-11-21 DIAGNOSIS — M545 Low back pain: Secondary | ICD-10-CM | POA: Diagnosis not present

## 2013-11-21 DIAGNOSIS — R262 Difficulty in walking, not elsewhere classified: Secondary | ICD-10-CM | POA: Diagnosis not present

## 2013-11-25 DIAGNOSIS — R262 Difficulty in walking, not elsewhere classified: Secondary | ICD-10-CM | POA: Diagnosis not present

## 2013-11-25 DIAGNOSIS — M4806 Spinal stenosis, lumbar region: Secondary | ICD-10-CM | POA: Diagnosis not present

## 2013-11-25 DIAGNOSIS — M545 Low back pain: Secondary | ICD-10-CM | POA: Diagnosis not present

## 2013-12-03 DIAGNOSIS — M545 Low back pain: Secondary | ICD-10-CM | POA: Diagnosis not present

## 2013-12-03 DIAGNOSIS — R262 Difficulty in walking, not elsewhere classified: Secondary | ICD-10-CM | POA: Diagnosis not present

## 2013-12-03 DIAGNOSIS — M4806 Spinal stenosis, lumbar region: Secondary | ICD-10-CM | POA: Diagnosis not present

## 2013-12-10 DIAGNOSIS — M4806 Spinal stenosis, lumbar region: Secondary | ICD-10-CM | POA: Diagnosis not present

## 2013-12-10 DIAGNOSIS — R262 Difficulty in walking, not elsewhere classified: Secondary | ICD-10-CM | POA: Diagnosis not present

## 2013-12-10 DIAGNOSIS — M545 Low back pain: Secondary | ICD-10-CM | POA: Diagnosis not present

## 2013-12-25 DIAGNOSIS — F419 Anxiety disorder, unspecified: Secondary | ICD-10-CM | POA: Diagnosis not present

## 2013-12-25 DIAGNOSIS — I10 Essential (primary) hypertension: Secondary | ICD-10-CM | POA: Diagnosis not present

## 2013-12-25 DIAGNOSIS — I482 Chronic atrial fibrillation: Secondary | ICD-10-CM | POA: Diagnosis not present

## 2014-01-05 DIAGNOSIS — M545 Low back pain: Secondary | ICD-10-CM | POA: Diagnosis not present

## 2014-01-05 DIAGNOSIS — M4806 Spinal stenosis, lumbar region: Secondary | ICD-10-CM | POA: Diagnosis not present

## 2014-01-05 DIAGNOSIS — R262 Difficulty in walking, not elsewhere classified: Secondary | ICD-10-CM | POA: Diagnosis not present

## 2014-01-15 DIAGNOSIS — M545 Low back pain: Secondary | ICD-10-CM | POA: Diagnosis not present

## 2014-01-15 DIAGNOSIS — R262 Difficulty in walking, not elsewhere classified: Secondary | ICD-10-CM | POA: Diagnosis not present

## 2014-01-15 DIAGNOSIS — M4806 Spinal stenosis, lumbar region: Secondary | ICD-10-CM | POA: Diagnosis not present

## 2014-01-20 DIAGNOSIS — M4806 Spinal stenosis, lumbar region: Secondary | ICD-10-CM | POA: Diagnosis not present

## 2014-01-20 DIAGNOSIS — M545 Low back pain: Secondary | ICD-10-CM | POA: Diagnosis not present

## 2014-01-20 DIAGNOSIS — R262 Difficulty in walking, not elsewhere classified: Secondary | ICD-10-CM | POA: Diagnosis not present

## 2014-01-21 DIAGNOSIS — R21 Rash and other nonspecific skin eruption: Secondary | ICD-10-CM | POA: Diagnosis not present

## 2014-01-29 DIAGNOSIS — M4806 Spinal stenosis, lumbar region: Secondary | ICD-10-CM | POA: Diagnosis not present

## 2014-01-29 DIAGNOSIS — M545 Low back pain: Secondary | ICD-10-CM | POA: Diagnosis not present

## 2014-01-29 DIAGNOSIS — R262 Difficulty in walking, not elsewhere classified: Secondary | ICD-10-CM | POA: Diagnosis not present

## 2014-02-13 DIAGNOSIS — M545 Low back pain: Secondary | ICD-10-CM | POA: Diagnosis not present

## 2014-02-13 DIAGNOSIS — M4806 Spinal stenosis, lumbar region: Secondary | ICD-10-CM | POA: Diagnosis not present

## 2014-02-13 DIAGNOSIS — R262 Difficulty in walking, not elsewhere classified: Secondary | ICD-10-CM | POA: Diagnosis not present

## 2014-02-18 DIAGNOSIS — R7301 Impaired fasting glucose: Secondary | ICD-10-CM | POA: Diagnosis not present

## 2014-02-18 DIAGNOSIS — F419 Anxiety disorder, unspecified: Secondary | ICD-10-CM | POA: Diagnosis not present

## 2014-02-18 DIAGNOSIS — I480 Paroxysmal atrial fibrillation: Secondary | ICD-10-CM | POA: Diagnosis not present

## 2014-02-18 DIAGNOSIS — I10 Essential (primary) hypertension: Secondary | ICD-10-CM | POA: Diagnosis not present

## 2014-04-07 DIAGNOSIS — M5126 Other intervertebral disc displacement, lumbar region: Secondary | ICD-10-CM | POA: Diagnosis not present

## 2014-04-07 DIAGNOSIS — M9903 Segmental and somatic dysfunction of lumbar region: Secondary | ICD-10-CM | POA: Diagnosis not present

## 2014-04-07 DIAGNOSIS — M9901 Segmental and somatic dysfunction of cervical region: Secondary | ICD-10-CM | POA: Diagnosis not present

## 2014-04-07 DIAGNOSIS — M9905 Segmental and somatic dysfunction of pelvic region: Secondary | ICD-10-CM | POA: Diagnosis not present

## 2014-04-07 DIAGNOSIS — M542 Cervicalgia: Secondary | ICD-10-CM | POA: Diagnosis not present

## 2014-05-14 DIAGNOSIS — H4322 Crystalline deposits in vitreous body, left eye: Secondary | ICD-10-CM | POA: Diagnosis not present

## 2014-05-14 DIAGNOSIS — R739 Hyperglycemia, unspecified: Secondary | ICD-10-CM | POA: Diagnosis not present

## 2014-05-20 DIAGNOSIS — F419 Anxiety disorder, unspecified: Secondary | ICD-10-CM | POA: Diagnosis not present

## 2014-05-20 DIAGNOSIS — Z79899 Other long term (current) drug therapy: Secondary | ICD-10-CM | POA: Diagnosis not present

## 2014-05-20 DIAGNOSIS — E119 Type 2 diabetes mellitus without complications: Secondary | ICD-10-CM | POA: Diagnosis not present

## 2014-05-20 DIAGNOSIS — R7301 Impaired fasting glucose: Secondary | ICD-10-CM | POA: Diagnosis not present

## 2014-05-20 DIAGNOSIS — I480 Paroxysmal atrial fibrillation: Secondary | ICD-10-CM | POA: Diagnosis not present

## 2014-05-20 DIAGNOSIS — I10 Essential (primary) hypertension: Secondary | ICD-10-CM | POA: Diagnosis not present

## 2014-05-26 DIAGNOSIS — M9901 Segmental and somatic dysfunction of cervical region: Secondary | ICD-10-CM | POA: Diagnosis not present

## 2014-05-26 DIAGNOSIS — M9903 Segmental and somatic dysfunction of lumbar region: Secondary | ICD-10-CM | POA: Diagnosis not present

## 2014-05-26 DIAGNOSIS — M542 Cervicalgia: Secondary | ICD-10-CM | POA: Diagnosis not present

## 2014-05-26 DIAGNOSIS — M9905 Segmental and somatic dysfunction of pelvic region: Secondary | ICD-10-CM | POA: Diagnosis not present

## 2014-05-26 DIAGNOSIS — M5126 Other intervertebral disc displacement, lumbar region: Secondary | ICD-10-CM | POA: Diagnosis not present

## 2014-06-18 DIAGNOSIS — E119 Type 2 diabetes mellitus without complications: Secondary | ICD-10-CM | POA: Diagnosis not present

## 2014-06-18 DIAGNOSIS — M9901 Segmental and somatic dysfunction of cervical region: Secondary | ICD-10-CM | POA: Diagnosis not present

## 2014-06-18 DIAGNOSIS — M545 Low back pain: Secondary | ICD-10-CM | POA: Diagnosis not present

## 2014-06-18 DIAGNOSIS — M9905 Segmental and somatic dysfunction of pelvic region: Secondary | ICD-10-CM | POA: Diagnosis not present

## 2014-06-18 DIAGNOSIS — M9903 Segmental and somatic dysfunction of lumbar region: Secondary | ICD-10-CM | POA: Diagnosis not present

## 2014-06-18 DIAGNOSIS — Z713 Dietary counseling and surveillance: Secondary | ICD-10-CM | POA: Diagnosis not present

## 2014-07-02 DIAGNOSIS — M545 Low back pain: Secondary | ICD-10-CM | POA: Diagnosis not present

## 2014-07-02 DIAGNOSIS — M9901 Segmental and somatic dysfunction of cervical region: Secondary | ICD-10-CM | POA: Diagnosis not present

## 2014-07-02 DIAGNOSIS — M9905 Segmental and somatic dysfunction of pelvic region: Secondary | ICD-10-CM | POA: Diagnosis not present

## 2014-07-02 DIAGNOSIS — M9903 Segmental and somatic dysfunction of lumbar region: Secondary | ICD-10-CM | POA: Diagnosis not present

## 2014-07-16 DIAGNOSIS — M9901 Segmental and somatic dysfunction of cervical region: Secondary | ICD-10-CM | POA: Diagnosis not present

## 2014-07-16 DIAGNOSIS — M545 Low back pain: Secondary | ICD-10-CM | POA: Diagnosis not present

## 2014-07-16 DIAGNOSIS — M9903 Segmental and somatic dysfunction of lumbar region: Secondary | ICD-10-CM | POA: Diagnosis not present

## 2014-07-16 DIAGNOSIS — M9905 Segmental and somatic dysfunction of pelvic region: Secondary | ICD-10-CM | POA: Diagnosis not present

## 2014-08-04 DIAGNOSIS — M9905 Segmental and somatic dysfunction of pelvic region: Secondary | ICD-10-CM | POA: Diagnosis not present

## 2014-08-04 DIAGNOSIS — M9901 Segmental and somatic dysfunction of cervical region: Secondary | ICD-10-CM | POA: Diagnosis not present

## 2014-08-04 DIAGNOSIS — M545 Low back pain: Secondary | ICD-10-CM | POA: Diagnosis not present

## 2014-08-04 DIAGNOSIS — M9903 Segmental and somatic dysfunction of lumbar region: Secondary | ICD-10-CM | POA: Diagnosis not present

## 2014-08-07 DIAGNOSIS — E119 Type 2 diabetes mellitus without complications: Secondary | ICD-10-CM | POA: Diagnosis not present

## 2014-08-17 DIAGNOSIS — E119 Type 2 diabetes mellitus without complications: Secondary | ICD-10-CM | POA: Diagnosis not present

## 2014-08-17 DIAGNOSIS — F419 Anxiety disorder, unspecified: Secondary | ICD-10-CM | POA: Diagnosis not present

## 2014-08-17 DIAGNOSIS — I480 Paroxysmal atrial fibrillation: Secondary | ICD-10-CM | POA: Diagnosis not present

## 2014-08-17 DIAGNOSIS — I10 Essential (primary) hypertension: Secondary | ICD-10-CM | POA: Diagnosis not present

## 2014-08-18 DIAGNOSIS — M9901 Segmental and somatic dysfunction of cervical region: Secondary | ICD-10-CM | POA: Diagnosis not present

## 2014-08-18 DIAGNOSIS — M9905 Segmental and somatic dysfunction of pelvic region: Secondary | ICD-10-CM | POA: Diagnosis not present

## 2014-08-18 DIAGNOSIS — M545 Low back pain: Secondary | ICD-10-CM | POA: Diagnosis not present

## 2014-08-18 DIAGNOSIS — M9903 Segmental and somatic dysfunction of lumbar region: Secondary | ICD-10-CM | POA: Diagnosis not present

## 2014-08-25 DIAGNOSIS — M545 Low back pain: Secondary | ICD-10-CM | POA: Diagnosis not present

## 2014-08-25 DIAGNOSIS — M9901 Segmental and somatic dysfunction of cervical region: Secondary | ICD-10-CM | POA: Diagnosis not present

## 2014-08-25 DIAGNOSIS — M9903 Segmental and somatic dysfunction of lumbar region: Secondary | ICD-10-CM | POA: Diagnosis not present

## 2014-08-25 DIAGNOSIS — M9905 Segmental and somatic dysfunction of pelvic region: Secondary | ICD-10-CM | POA: Diagnosis not present

## 2014-09-08 DIAGNOSIS — M9903 Segmental and somatic dysfunction of lumbar region: Secondary | ICD-10-CM | POA: Diagnosis not present

## 2014-09-08 DIAGNOSIS — M9901 Segmental and somatic dysfunction of cervical region: Secondary | ICD-10-CM | POA: Diagnosis not present

## 2014-09-08 DIAGNOSIS — M9905 Segmental and somatic dysfunction of pelvic region: Secondary | ICD-10-CM | POA: Diagnosis not present

## 2014-09-08 DIAGNOSIS — M545 Low back pain: Secondary | ICD-10-CM | POA: Diagnosis not present

## 2014-09-24 DIAGNOSIS — M9901 Segmental and somatic dysfunction of cervical region: Secondary | ICD-10-CM | POA: Diagnosis not present

## 2014-09-24 DIAGNOSIS — M545 Low back pain: Secondary | ICD-10-CM | POA: Diagnosis not present

## 2014-09-24 DIAGNOSIS — M9905 Segmental and somatic dysfunction of pelvic region: Secondary | ICD-10-CM | POA: Diagnosis not present

## 2014-09-24 DIAGNOSIS — M9903 Segmental and somatic dysfunction of lumbar region: Secondary | ICD-10-CM | POA: Diagnosis not present

## 2014-10-01 DIAGNOSIS — M9903 Segmental and somatic dysfunction of lumbar region: Secondary | ICD-10-CM | POA: Diagnosis not present

## 2014-10-01 DIAGNOSIS — M545 Low back pain: Secondary | ICD-10-CM | POA: Diagnosis not present

## 2014-10-01 DIAGNOSIS — M9901 Segmental and somatic dysfunction of cervical region: Secondary | ICD-10-CM | POA: Diagnosis not present

## 2014-10-01 DIAGNOSIS — M9905 Segmental and somatic dysfunction of pelvic region: Secondary | ICD-10-CM | POA: Diagnosis not present

## 2014-10-13 DIAGNOSIS — F419 Anxiety disorder, unspecified: Secondary | ICD-10-CM | POA: Diagnosis not present

## 2014-10-13 DIAGNOSIS — I480 Paroxysmal atrial fibrillation: Secondary | ICD-10-CM | POA: Diagnosis not present

## 2014-10-13 DIAGNOSIS — I10 Essential (primary) hypertension: Secondary | ICD-10-CM | POA: Diagnosis not present

## 2014-10-29 DIAGNOSIS — E119 Type 2 diabetes mellitus without complications: Secondary | ICD-10-CM | POA: Diagnosis not present

## 2014-11-20 DIAGNOSIS — I10 Essential (primary) hypertension: Secondary | ICD-10-CM | POA: Diagnosis not present

## 2014-11-20 DIAGNOSIS — E119 Type 2 diabetes mellitus without complications: Secondary | ICD-10-CM | POA: Diagnosis not present

## 2014-11-20 DIAGNOSIS — Z79899 Other long term (current) drug therapy: Secondary | ICD-10-CM | POA: Diagnosis not present

## 2014-11-30 DIAGNOSIS — R21 Rash and other nonspecific skin eruption: Secondary | ICD-10-CM | POA: Diagnosis not present

## 2014-11-30 DIAGNOSIS — E119 Type 2 diabetes mellitus without complications: Secondary | ICD-10-CM | POA: Diagnosis not present

## 2014-11-30 DIAGNOSIS — I10 Essential (primary) hypertension: Secondary | ICD-10-CM | POA: Diagnosis not present

## 2014-11-30 DIAGNOSIS — F419 Anxiety disorder, unspecified: Secondary | ICD-10-CM | POA: Diagnosis not present

## 2014-11-30 DIAGNOSIS — I480 Paroxysmal atrial fibrillation: Secondary | ICD-10-CM | POA: Diagnosis not present

## 2014-12-22 DIAGNOSIS — L219 Seborrheic dermatitis, unspecified: Secondary | ICD-10-CM | POA: Diagnosis not present

## 2015-01-17 HISTORY — PX: CATARACT EXTRACTION: SUR2

## 2015-02-09 DIAGNOSIS — M9901 Segmental and somatic dysfunction of cervical region: Secondary | ICD-10-CM | POA: Diagnosis not present

## 2015-02-09 DIAGNOSIS — M9903 Segmental and somatic dysfunction of lumbar region: Secondary | ICD-10-CM | POA: Diagnosis not present

## 2015-02-09 DIAGNOSIS — M545 Low back pain: Secondary | ICD-10-CM | POA: Diagnosis not present

## 2015-02-09 DIAGNOSIS — M9905 Segmental and somatic dysfunction of pelvic region: Secondary | ICD-10-CM | POA: Diagnosis not present

## 2015-02-16 DIAGNOSIS — M9905 Segmental and somatic dysfunction of pelvic region: Secondary | ICD-10-CM | POA: Diagnosis not present

## 2015-02-16 DIAGNOSIS — M9903 Segmental and somatic dysfunction of lumbar region: Secondary | ICD-10-CM | POA: Diagnosis not present

## 2015-02-16 DIAGNOSIS — M9901 Segmental and somatic dysfunction of cervical region: Secondary | ICD-10-CM | POA: Diagnosis not present

## 2015-02-16 DIAGNOSIS — M545 Low back pain: Secondary | ICD-10-CM | POA: Diagnosis not present

## 2015-02-22 DIAGNOSIS — Z0001 Encounter for general adult medical examination with abnormal findings: Secondary | ICD-10-CM | POA: Diagnosis not present

## 2015-02-22 DIAGNOSIS — I480 Paroxysmal atrial fibrillation: Secondary | ICD-10-CM | POA: Diagnosis not present

## 2015-02-22 DIAGNOSIS — E119 Type 2 diabetes mellitus without complications: Secondary | ICD-10-CM | POA: Diagnosis not present

## 2015-02-22 DIAGNOSIS — F419 Anxiety disorder, unspecified: Secondary | ICD-10-CM | POA: Diagnosis not present

## 2015-02-22 DIAGNOSIS — Z125 Encounter for screening for malignant neoplasm of prostate: Secondary | ICD-10-CM | POA: Diagnosis not present

## 2015-02-22 DIAGNOSIS — R682 Dry mouth, unspecified: Secondary | ICD-10-CM | POA: Diagnosis not present

## 2015-02-22 DIAGNOSIS — I10 Essential (primary) hypertension: Secondary | ICD-10-CM | POA: Diagnosis not present

## 2015-02-24 DIAGNOSIS — M542 Cervicalgia: Secondary | ICD-10-CM | POA: Diagnosis not present

## 2015-02-24 DIAGNOSIS — M9905 Segmental and somatic dysfunction of pelvic region: Secondary | ICD-10-CM | POA: Diagnosis not present

## 2015-02-24 DIAGNOSIS — M545 Low back pain: Secondary | ICD-10-CM | POA: Diagnosis not present

## 2015-02-24 DIAGNOSIS — M9901 Segmental and somatic dysfunction of cervical region: Secondary | ICD-10-CM | POA: Diagnosis not present

## 2015-02-24 DIAGNOSIS — M9903 Segmental and somatic dysfunction of lumbar region: Secondary | ICD-10-CM | POA: Diagnosis not present

## 2015-04-29 DIAGNOSIS — M545 Low back pain: Secondary | ICD-10-CM | POA: Diagnosis not present

## 2015-04-29 DIAGNOSIS — M542 Cervicalgia: Secondary | ICD-10-CM | POA: Diagnosis not present

## 2015-04-29 DIAGNOSIS — M9901 Segmental and somatic dysfunction of cervical region: Secondary | ICD-10-CM | POA: Diagnosis not present

## 2015-04-29 DIAGNOSIS — M9905 Segmental and somatic dysfunction of pelvic region: Secondary | ICD-10-CM | POA: Diagnosis not present

## 2015-04-29 DIAGNOSIS — M9903 Segmental and somatic dysfunction of lumbar region: Secondary | ICD-10-CM | POA: Diagnosis not present

## 2015-05-04 DIAGNOSIS — M9901 Segmental and somatic dysfunction of cervical region: Secondary | ICD-10-CM | POA: Diagnosis not present

## 2015-05-04 DIAGNOSIS — M9903 Segmental and somatic dysfunction of lumbar region: Secondary | ICD-10-CM | POA: Diagnosis not present

## 2015-05-04 DIAGNOSIS — M542 Cervicalgia: Secondary | ICD-10-CM | POA: Diagnosis not present

## 2015-05-04 DIAGNOSIS — M9905 Segmental and somatic dysfunction of pelvic region: Secondary | ICD-10-CM | POA: Diagnosis not present

## 2015-05-04 DIAGNOSIS — M545 Low back pain: Secondary | ICD-10-CM | POA: Diagnosis not present

## 2015-05-11 DIAGNOSIS — M545 Low back pain: Secondary | ICD-10-CM | POA: Diagnosis not present

## 2015-05-11 DIAGNOSIS — M9901 Segmental and somatic dysfunction of cervical region: Secondary | ICD-10-CM | POA: Diagnosis not present

## 2015-05-11 DIAGNOSIS — M542 Cervicalgia: Secondary | ICD-10-CM | POA: Diagnosis not present

## 2015-05-11 DIAGNOSIS — M9903 Segmental and somatic dysfunction of lumbar region: Secondary | ICD-10-CM | POA: Diagnosis not present

## 2015-05-11 DIAGNOSIS — M9905 Segmental and somatic dysfunction of pelvic region: Secondary | ICD-10-CM | POA: Diagnosis not present

## 2015-05-17 DIAGNOSIS — H2589 Other age-related cataract: Secondary | ICD-10-CM | POA: Diagnosis not present

## 2015-05-19 DIAGNOSIS — E119 Type 2 diabetes mellitus without complications: Secondary | ICD-10-CM | POA: Diagnosis not present

## 2015-05-19 DIAGNOSIS — I480 Paroxysmal atrial fibrillation: Secondary | ICD-10-CM | POA: Diagnosis not present

## 2015-05-19 DIAGNOSIS — I10 Essential (primary) hypertension: Secondary | ICD-10-CM | POA: Diagnosis not present

## 2015-05-19 DIAGNOSIS — F419 Anxiety disorder, unspecified: Secondary | ICD-10-CM | POA: Diagnosis not present

## 2015-05-20 DIAGNOSIS — M545 Low back pain: Secondary | ICD-10-CM | POA: Diagnosis not present

## 2015-05-20 DIAGNOSIS — M9903 Segmental and somatic dysfunction of lumbar region: Secondary | ICD-10-CM | POA: Diagnosis not present

## 2015-05-20 DIAGNOSIS — M9901 Segmental and somatic dysfunction of cervical region: Secondary | ICD-10-CM | POA: Diagnosis not present

## 2015-05-20 DIAGNOSIS — M542 Cervicalgia: Secondary | ICD-10-CM | POA: Diagnosis not present

## 2015-05-20 DIAGNOSIS — M9905 Segmental and somatic dysfunction of pelvic region: Secondary | ICD-10-CM | POA: Diagnosis not present

## 2015-05-26 DIAGNOSIS — E119 Type 2 diabetes mellitus without complications: Secondary | ICD-10-CM | POA: Diagnosis not present

## 2015-06-01 DIAGNOSIS — M542 Cervicalgia: Secondary | ICD-10-CM | POA: Diagnosis not present

## 2015-06-01 DIAGNOSIS — M545 Low back pain: Secondary | ICD-10-CM | POA: Diagnosis not present

## 2015-06-01 DIAGNOSIS — M9905 Segmental and somatic dysfunction of pelvic region: Secondary | ICD-10-CM | POA: Diagnosis not present

## 2015-06-01 DIAGNOSIS — M9903 Segmental and somatic dysfunction of lumbar region: Secondary | ICD-10-CM | POA: Diagnosis not present

## 2015-06-01 DIAGNOSIS — M9901 Segmental and somatic dysfunction of cervical region: Secondary | ICD-10-CM | POA: Diagnosis not present

## 2015-06-08 DIAGNOSIS — M9903 Segmental and somatic dysfunction of lumbar region: Secondary | ICD-10-CM | POA: Diagnosis not present

## 2015-06-08 DIAGNOSIS — M9901 Segmental and somatic dysfunction of cervical region: Secondary | ICD-10-CM | POA: Diagnosis not present

## 2015-06-08 DIAGNOSIS — M542 Cervicalgia: Secondary | ICD-10-CM | POA: Diagnosis not present

## 2015-06-08 DIAGNOSIS — M545 Low back pain: Secondary | ICD-10-CM | POA: Diagnosis not present

## 2015-06-08 DIAGNOSIS — M9905 Segmental and somatic dysfunction of pelvic region: Secondary | ICD-10-CM | POA: Diagnosis not present

## 2015-07-30 DIAGNOSIS — F419 Anxiety disorder, unspecified: Secondary | ICD-10-CM | POA: Diagnosis not present

## 2015-07-30 DIAGNOSIS — Z1211 Encounter for screening for malignant neoplasm of colon: Secondary | ICD-10-CM | POA: Diagnosis not present

## 2015-07-30 DIAGNOSIS — R194 Change in bowel habit: Secondary | ICD-10-CM | POA: Diagnosis not present

## 2015-08-02 DIAGNOSIS — Z1211 Encounter for screening for malignant neoplasm of colon: Secondary | ICD-10-CM | POA: Diagnosis not present

## 2015-08-17 DIAGNOSIS — H25811 Combined forms of age-related cataract, right eye: Secondary | ICD-10-CM | POA: Diagnosis not present

## 2015-08-22 DIAGNOSIS — R197 Diarrhea, unspecified: Secondary | ICD-10-CM | POA: Diagnosis not present

## 2015-09-01 DIAGNOSIS — H25811 Combined forms of age-related cataract, right eye: Secondary | ICD-10-CM | POA: Diagnosis not present

## 2015-09-01 DIAGNOSIS — H2511 Age-related nuclear cataract, right eye: Secondary | ICD-10-CM | POA: Diagnosis not present

## 2015-09-08 DIAGNOSIS — H2512 Age-related nuclear cataract, left eye: Secondary | ICD-10-CM | POA: Diagnosis not present

## 2015-09-08 DIAGNOSIS — H25812 Combined forms of age-related cataract, left eye: Secondary | ICD-10-CM | POA: Diagnosis not present

## 2015-09-24 DIAGNOSIS — F419 Anxiety disorder, unspecified: Secondary | ICD-10-CM | POA: Diagnosis not present

## 2015-09-24 DIAGNOSIS — I10 Essential (primary) hypertension: Secondary | ICD-10-CM | POA: Diagnosis not present

## 2015-09-24 DIAGNOSIS — E119 Type 2 diabetes mellitus without complications: Secondary | ICD-10-CM | POA: Diagnosis not present

## 2015-09-24 DIAGNOSIS — I480 Paroxysmal atrial fibrillation: Secondary | ICD-10-CM | POA: Diagnosis not present

## 2015-10-04 DIAGNOSIS — K582 Mixed irritable bowel syndrome: Secondary | ICD-10-CM | POA: Diagnosis not present

## 2015-10-04 DIAGNOSIS — F419 Anxiety disorder, unspecified: Secondary | ICD-10-CM | POA: Diagnosis not present

## 2015-10-04 DIAGNOSIS — E119 Type 2 diabetes mellitus without complications: Secondary | ICD-10-CM | POA: Diagnosis not present

## 2015-10-04 DIAGNOSIS — I10 Essential (primary) hypertension: Secondary | ICD-10-CM | POA: Diagnosis not present

## 2015-11-10 DIAGNOSIS — I480 Paroxysmal atrial fibrillation: Secondary | ICD-10-CM | POA: Diagnosis not present

## 2015-12-08 DIAGNOSIS — K582 Mixed irritable bowel syndrome: Secondary | ICD-10-CM | POA: Diagnosis not present

## 2015-12-08 DIAGNOSIS — N200 Calculus of kidney: Secondary | ICD-10-CM | POA: Diagnosis not present

## 2015-12-08 DIAGNOSIS — I10 Essential (primary) hypertension: Secondary | ICD-10-CM | POA: Diagnosis not present

## 2015-12-15 ENCOUNTER — Encounter: Payer: Self-pay | Admitting: Sports Medicine

## 2015-12-15 ENCOUNTER — Ambulatory Visit (INDEPENDENT_AMBULATORY_CARE_PROVIDER_SITE_OTHER): Payer: Medicare Other | Admitting: Sports Medicine

## 2015-12-15 DIAGNOSIS — M2040 Other hammer toe(s) (acquired), unspecified foot: Secondary | ICD-10-CM

## 2015-12-15 DIAGNOSIS — I739 Peripheral vascular disease, unspecified: Secondary | ICD-10-CM | POA: Diagnosis not present

## 2015-12-15 DIAGNOSIS — E1140 Type 2 diabetes mellitus with diabetic neuropathy, unspecified: Secondary | ICD-10-CM

## 2015-12-15 DIAGNOSIS — M21619 Bunion of unspecified foot: Secondary | ICD-10-CM

## 2015-12-15 NOTE — Progress Notes (Signed)
Subjective: Keith Mccall is a 82 y.o. male patient with history of diabetes who presents to office today for evaluation for diabetic shoes. States that sometimes his current shoes causes instability. Patient denies any new cramping, numbness, burning or tingling in the legs.  There are no active problems to display for this patient.  Current Outpatient Prescriptions on File Prior to Visit  Medication Sig Dispense Refill  . ALPRAZolam (XANAX) 1 MG tablet Take 1 mg by mouth 2 (two) times daily as needed.      . Cholecalciferol (VITAMIN D-3) 5000 UNITS TABS Take 1 tablet by mouth daily.      Marland Kitchen Hawthorne Berry 500 MG CAPS Take by mouth at bedtime.      . Lactase (DAIRY DIGESTIVE PO) Take by mouth daily.      . Magnesium 200 MG TABS Take 3 tablets by mouth daily.      . metoprolol tartrate (LOPRESSOR) 25 MG tablet Take 12.5 mg by mouth 2 (two) times daily.      . Omega-3 Fatty Acids (SUPER OMEGA 3 PO) Take by mouth.      . Probiotic Product (PROBIOTIC PO) Take by mouth daily.      . Taurine 1000 MG CAPS Take 2 capsules by mouth daily.      Marland Kitchen Ubiquinol 50 MG CAPS Take by mouth daily.       No current facility-administered medications on file prior to visit.    Allergies  Allergen Reactions  . Penicillins   . Sulfa Antibiotics     No results found for this or any previous visit (from the past 2160 hour(s)).  Objective: General: Patient is awake, alert, and oriented x 3 and in no acute distress.  Integument: Skin is warm, dry and supple bilateral. Nails are short mildly dystrophic, 1-5 bilateral. No signs of infection. No open lesions or preulcerative lesions present bilateral. Remaining integument unremarkable.  Vasculature:  Dorsalis Pedis pulse 1/4 bilateral. Posterior Tibial pulse  1/4 bilateral.  Capillary fill time <5 sec 1-5 bilateral.No hair growth to the level of the digits.Temperature gradient within normal limits. No varicosities present bilateral. 1+ pitting edema present  bilateral.   Neurology: The patient has intact sensation measured with a 5.07/10g Semmes Weinstein Monofilament at all pedal sites bilateral . Vibratory sensation diminished bilateral with tuning fork R>L history of spinal stenosis. No Babinski sign present bilateral.   Musculoskeletal: Asymptomatic bunion and hammertoe pedal deformities noted bilateral. Muscular strength 5/5 in all lower extremity muscular groups bilateral without pain on range of motion. No tenderness with calf compression bilateral.  Assessment and Plan: Problem List Items Addressed This Visit    None    Visit Diagnoses    Type 2 diabetes mellitus with diabetic neuropathy, without long-term current use of insulin (HCC)    -  Primary   PVD (peripheral vascular disease) ([place])       Bunion       Hammer toe, unspecified laterality         -Examined patient. -Discussed and educated patient on diabetic foot care, especially with  regards to the vascular, neurological and musculoskeletal systems.  -Stressed the importance of good glycemic control and the detriment of not  controlling glucose levels in relation to the foot. -[employer] step diabetic shoe order form was completed; office to contact primary care for approval / certification;  Office to arrange shoe fitting and dispensing.  -Recommend compression stockings for edema control -Answered all patient questions -Patient to return to pick  up shoes or in 3 months for at risk foot care -Patient advised to call the office if any problems or questions arise in the meantime.  Landis Martins, DPM

## 2015-12-15 NOTE — Patient Instructions (Signed)

## 2016-01-05 ENCOUNTER — Telehealth: Payer: Self-pay | Admitting: *Deleted

## 2016-01-05 NOTE — Telephone Encounter (Signed)
Pt states his diabetic socks make a ring around his calf.01/05/2016-I spoke with pt and asked him if the socks make a deep, red ring with swelling above and below the ring. Pt states it makes a ring on his leg. I told pt if the ring was pale pink and not deep it was necessary to have a little elastic to hold up the socks. I told him if he was in [place] he could stop there for the assistant to evaluate. Pt states he is on his way to [place]. I told him if he would come to the [place] office I would help, pt states he doesn't have the socks on.

## 2016-01-13 ENCOUNTER — Telehealth: Payer: Self-pay | Admitting: Sports Medicine

## 2016-01-13 NOTE — Telephone Encounter (Signed)
[employer] trader paperwork and calls for shoes

## 2016-01-13 NOTE — Telephone Encounter (Signed)
Pt called checking the status of his diabetic shoes. He said he was molded for the shoes on 11.29.17 in [place] but has not heard anything back.

## 2016-01-14 NOTE — Telephone Encounter (Signed)
I will not be able to do anything until I get back to the [place] office  on Wednesday next week.

## 2016-01-21 ENCOUNTER — Other Ambulatory Visit: Payer: Medicare Other

## 2016-01-24 DIAGNOSIS — I10 Essential (primary) hypertension: Secondary | ICD-10-CM | POA: Diagnosis not present

## 2016-01-24 DIAGNOSIS — I480 Paroxysmal atrial fibrillation: Secondary | ICD-10-CM | POA: Diagnosis not present

## 2016-01-24 DIAGNOSIS — E119 Type 2 diabetes mellitus without complications: Secondary | ICD-10-CM | POA: Diagnosis not present

## 2016-01-24 DIAGNOSIS — Z79899 Other long term (current) drug therapy: Secondary | ICD-10-CM | POA: Diagnosis not present

## 2016-01-24 DIAGNOSIS — R0981 Nasal congestion: Secondary | ICD-10-CM | POA: Diagnosis not present

## 2016-01-24 DIAGNOSIS — F419 Anxiety disorder, unspecified: Secondary | ICD-10-CM | POA: Diagnosis not present

## 2016-01-24 DIAGNOSIS — R51 Headache: Secondary | ICD-10-CM | POA: Diagnosis not present

## 2016-01-24 DIAGNOSIS — K582 Mixed irritable bowel syndrome: Secondary | ICD-10-CM | POA: Diagnosis not present

## 2016-01-28 ENCOUNTER — Encounter: Payer: Self-pay | Admitting: Sports Medicine

## 2016-01-28 ENCOUNTER — Ambulatory Visit (INDEPENDENT_AMBULATORY_CARE_PROVIDER_SITE_OTHER): Payer: Medicare Other | Admitting: Sports Medicine

## 2016-01-28 DIAGNOSIS — M21619 Bunion of unspecified foot: Secondary | ICD-10-CM

## 2016-01-28 DIAGNOSIS — M2040 Other hammer toe(s) (acquired), unspecified foot: Secondary | ICD-10-CM | POA: Diagnosis not present

## 2016-01-28 DIAGNOSIS — I739 Peripheral vascular disease, unspecified: Secondary | ICD-10-CM | POA: Diagnosis not present

## 2016-01-28 DIAGNOSIS — E1140 Type 2 diabetes mellitus with diabetic neuropathy, unspecified: Secondary | ICD-10-CM

## 2016-01-28 NOTE — Progress Notes (Signed)
Diabetic shoes and insoles were dispensed to patient with wear and break in period explained. Also took the time to answer patient's questions regarding compression stockings. Patient to return to office as scheduled. -Dr. Cannon Kettle

## 2016-02-16 ENCOUNTER — Encounter: Payer: Self-pay | Admitting: Sports Medicine

## 2016-02-16 ENCOUNTER — Ambulatory Visit (INDEPENDENT_AMBULATORY_CARE_PROVIDER_SITE_OTHER): Payer: Medicare Other | Admitting: Sports Medicine

## 2016-02-16 DIAGNOSIS — M21619 Bunion of unspecified foot: Secondary | ICD-10-CM | POA: Diagnosis not present

## 2016-02-16 DIAGNOSIS — M2040 Other hammer toe(s) (acquired), unspecified foot: Secondary | ICD-10-CM | POA: Diagnosis not present

## 2016-02-16 DIAGNOSIS — E1140 Type 2 diabetes mellitus with diabetic neuropathy, unspecified: Secondary | ICD-10-CM

## 2016-02-16 DIAGNOSIS — I739 Peripheral vascular disease, unspecified: Secondary | ICD-10-CM | POA: Diagnosis not present

## 2016-02-16 NOTE — Progress Notes (Signed)
Subjective: Keith Mccall is a 83 y.o. male patient with history of diabetes who presents to office today for check after getting diabetic shoes;states that they seem too wide. Denies any other issues.  There are no active problems to display for this patient.  Current Outpatient Prescriptions on File Prior to Visit  Medication Sig Dispense Refill  . ACCU-CHEK AVIVA PLUS test strip USE TO CHECK BLOOD SUGAR TWICE A DAY  7  . ALPRAZolam (XANAX) 1 MG tablet Take 1 mg by mouth 2 (two) times daily as needed.      . Cholecalciferol (VITAMIN D-3) 5000 UNITS TABS Take 1 tablet by mouth daily.      . DUREZOL 0.05 % EMUL STARTING AFTER SURGERY INSTILL 1 DROP IN LEFT EYE 2 TIMES DAILY FOR 1 WEEK THEN STOP  2  . Hawthorne Berry 500 MG CAPS Take by mouth at bedtime.      . Lactase (DAIRY DIGESTIVE PO) Take by mouth daily.      . Magnesium 200 MG TABS Take 3 tablets by mouth daily.      . metoprolol tartrate (LOPRESSOR) 25 MG tablet Take 12.5 mg by mouth 2 (two) times daily.      Marland Kitchen ofloxacin (OCUFLOX) 0.3 % ophthalmic solution STARTING AFTER SURGERY, INSTIL 1 DROP TO AFFECTED EYE TWICE A DAY FOR 1 WEEK IN LEFT EYE THEN STOP  1  . Omega-3 Fatty Acids (SUPER OMEGA 3 PO) Take by mouth.      . Probiotic Product (PROBIOTIC PO) Take by mouth daily.      . Taurine 1000 MG CAPS Take 2 capsules by mouth daily.      Marland Kitchen Ubiquinol 50 MG CAPS Take by mouth daily.       No current facility-administered medications on file prior to visit.    Allergies  Allergen Reactions  . Penicillins   . Sulfa Antibiotics     No results found for this or any previous visit (from the past 2160 hour(s)).  Objective: General: Patient is awake, alert, and oriented x 3 and in no acute distress. Anxious personality.  Integument: Skin is warm, dry and supple bilateral. Nails are short mildly dystrophic, 1-5 bilateral. No signs of infection. No open lesions or preulcerative lesions present bilateral. Remaining integument  unremarkable.  Vasculature:  Dorsalis Pedis pulse 1/4 bilateral. Posterior Tibial pulse  1/4 bilateral.  Capillary fill time <5 sec 1-5 bilateral.No hair growth to the level of the digits.Temperature gradient within normal limits. No varicosities present bilateral. 1+ pitting edema present bilateral.   Neurology: The patient has intact sensation measured with a 5.07/10g Semmes Weinstein Monofilament at all pedal sites bilateral . Vibratory sensation diminished bilateral with tuning fork R>L history of spinal stenosis. No Babinski sign present bilateral.   Musculoskeletal: Asymptomatic bunion and hammertoe pedal deformities noted bilateral. Muscular strength 5/5 in all lower extremity muscular groups bilateral without pain on range of motion. No tenderness with calf compression bilateral.  Gait mild heel slippage with diabetic shoes  Assessment and Plan: Problem List Items Addressed This Visit    None    Visit Diagnoses    Type 2 diabetes mellitus with diabetic neuropathy, without long-term current use of insulin (HCC)    -  Primary   PVD (peripheral vascular disease) ([place])       Bunion       Hammer toe, unspecified laterality          -Examined patient. -Discussed and educated patient on diabetic foot care, especially  with  regards to the vascular, neurological and musculoskeletal systems.  -Stressed the importance of good glycemic control and the detriment of not  controlling glucose levels in relation to the foot. - Added felt to tongue of shoes and performed heel lock lacing with improvement; advised patient to continue with break in period -Advised patient to consider thicker sock and compression type -Answered all patient questions -Patient to returnin 3 months for at risk foot care -Patient advised to call the office if any problems or questions arise in the meantime.  Landis Martins, DPM

## 2016-03-29 DIAGNOSIS — K582 Mixed irritable bowel syndrome: Secondary | ICD-10-CM | POA: Diagnosis not present

## 2016-03-29 DIAGNOSIS — Z0001 Encounter for general adult medical examination with abnormal findings: Secondary | ICD-10-CM | POA: Diagnosis not present

## 2016-03-29 DIAGNOSIS — E119 Type 2 diabetes mellitus without complications: Secondary | ICD-10-CM | POA: Diagnosis not present

## 2016-03-29 DIAGNOSIS — Z1389 Encounter for screening for other disorder: Secondary | ICD-10-CM | POA: Diagnosis not present

## 2016-03-29 DIAGNOSIS — I10 Essential (primary) hypertension: Secondary | ICD-10-CM | POA: Diagnosis not present

## 2016-03-29 DIAGNOSIS — I480 Paroxysmal atrial fibrillation: Secondary | ICD-10-CM | POA: Diagnosis not present

## 2016-03-29 DIAGNOSIS — F419 Anxiety disorder, unspecified: Secondary | ICD-10-CM | POA: Diagnosis not present

## 2016-03-30 DIAGNOSIS — K582 Mixed irritable bowel syndrome: Secondary | ICD-10-CM | POA: Diagnosis not present

## 2016-03-30 DIAGNOSIS — R1032 Left lower quadrant pain: Secondary | ICD-10-CM | POA: Diagnosis not present

## 2016-04-04 DIAGNOSIS — K8020 Calculus of gallbladder without cholecystitis without obstruction: Secondary | ICD-10-CM | POA: Diagnosis not present

## 2016-04-04 DIAGNOSIS — I2510 Atherosclerotic heart disease of native coronary artery without angina pectoris: Secondary | ICD-10-CM | POA: Diagnosis not present

## 2016-04-04 DIAGNOSIS — R109 Unspecified abdominal pain: Secondary | ICD-10-CM | POA: Diagnosis not present

## 2016-04-04 DIAGNOSIS — I700 Atherosclerosis of aorta: Secondary | ICD-10-CM | POA: Diagnosis not present

## 2016-04-04 DIAGNOSIS — R1032 Left lower quadrant pain: Secondary | ICD-10-CM | POA: Diagnosis not present

## 2016-04-04 DIAGNOSIS — N200 Calculus of kidney: Secondary | ICD-10-CM | POA: Diagnosis not present

## 2016-04-17 DIAGNOSIS — N200 Calculus of kidney: Secondary | ICD-10-CM | POA: Diagnosis not present

## 2016-04-17 DIAGNOSIS — K8020 Calculus of gallbladder without cholecystitis without obstruction: Secondary | ICD-10-CM | POA: Diagnosis not present

## 2016-04-17 DIAGNOSIS — N281 Cyst of kidney, acquired: Secondary | ICD-10-CM | POA: Diagnosis not present

## 2016-04-17 DIAGNOSIS — I359 Nonrheumatic aortic valve disorder, unspecified: Secondary | ICD-10-CM | POA: Diagnosis not present

## 2016-04-17 DIAGNOSIS — I700 Atherosclerosis of aorta: Secondary | ICD-10-CM | POA: Diagnosis not present

## 2016-05-04 DIAGNOSIS — I359 Nonrheumatic aortic valve disorder, unspecified: Secondary | ICD-10-CM | POA: Diagnosis not present

## 2016-05-23 DIAGNOSIS — I10 Essential (primary) hypertension: Secondary | ICD-10-CM | POA: Diagnosis not present

## 2016-05-23 DIAGNOSIS — R5383 Other fatigue: Secondary | ICD-10-CM | POA: Diagnosis not present

## 2016-05-23 DIAGNOSIS — Z79899 Other long term (current) drug therapy: Secondary | ICD-10-CM | POA: Diagnosis not present

## 2016-05-23 DIAGNOSIS — E119 Type 2 diabetes mellitus without complications: Secondary | ICD-10-CM | POA: Diagnosis not present

## 2016-05-23 DIAGNOSIS — I480 Paroxysmal atrial fibrillation: Secondary | ICD-10-CM | POA: Diagnosis not present

## 2016-06-30 DIAGNOSIS — I480 Paroxysmal atrial fibrillation: Secondary | ICD-10-CM | POA: Diagnosis not present

## 2016-06-30 DIAGNOSIS — R079 Chest pain, unspecified: Secondary | ICD-10-CM | POA: Diagnosis not present

## 2016-06-30 DIAGNOSIS — F419 Anxiety disorder, unspecified: Secondary | ICD-10-CM | POA: Diagnosis not present

## 2016-07-26 DIAGNOSIS — M79604 Pain in right leg: Secondary | ICD-10-CM | POA: Diagnosis not present

## 2016-07-26 DIAGNOSIS — R0989 Other specified symptoms and signs involving the circulatory and respiratory systems: Secondary | ICD-10-CM | POA: Diagnosis not present

## 2016-07-26 DIAGNOSIS — M79606 Pain in leg, unspecified: Secondary | ICD-10-CM | POA: Diagnosis not present

## 2016-07-26 DIAGNOSIS — M79605 Pain in left leg: Secondary | ICD-10-CM | POA: Diagnosis not present

## 2016-08-03 DIAGNOSIS — E119 Type 2 diabetes mellitus without complications: Secondary | ICD-10-CM | POA: Diagnosis not present

## 2016-08-03 DIAGNOSIS — H4322 Crystalline deposits in vitreous body, left eye: Secondary | ICD-10-CM | POA: Diagnosis not present

## 2016-08-03 DIAGNOSIS — Z961 Presence of intraocular lens: Secondary | ICD-10-CM | POA: Diagnosis not present

## 2016-08-09 DIAGNOSIS — F419 Anxiety disorder, unspecified: Secondary | ICD-10-CM | POA: Diagnosis not present

## 2016-08-09 DIAGNOSIS — I10 Essential (primary) hypertension: Secondary | ICD-10-CM | POA: Diagnosis not present

## 2016-08-09 DIAGNOSIS — E119 Type 2 diabetes mellitus without complications: Secondary | ICD-10-CM | POA: Diagnosis not present

## 2016-08-09 DIAGNOSIS — I480 Paroxysmal atrial fibrillation: Secondary | ICD-10-CM | POA: Diagnosis not present

## 2016-08-14 ENCOUNTER — Other Ambulatory Visit: Payer: Self-pay

## 2016-08-15 DIAGNOSIS — M5416 Radiculopathy, lumbar region: Secondary | ICD-10-CM | POA: Diagnosis not present

## 2016-08-15 DIAGNOSIS — M9905 Segmental and somatic dysfunction of pelvic region: Secondary | ICD-10-CM | POA: Diagnosis not present

## 2016-08-15 DIAGNOSIS — M9903 Segmental and somatic dysfunction of lumbar region: Secondary | ICD-10-CM | POA: Diagnosis not present

## 2016-08-15 DIAGNOSIS — M9902 Segmental and somatic dysfunction of thoracic region: Secondary | ICD-10-CM | POA: Diagnosis not present

## 2016-08-15 DIAGNOSIS — M62830 Muscle spasm of back: Secondary | ICD-10-CM | POA: Diagnosis not present

## 2016-08-22 DIAGNOSIS — M62830 Muscle spasm of back: Secondary | ICD-10-CM | POA: Diagnosis not present

## 2016-08-22 DIAGNOSIS — M9902 Segmental and somatic dysfunction of thoracic region: Secondary | ICD-10-CM | POA: Diagnosis not present

## 2016-08-22 DIAGNOSIS — M9905 Segmental and somatic dysfunction of pelvic region: Secondary | ICD-10-CM | POA: Diagnosis not present

## 2016-08-22 DIAGNOSIS — M9903 Segmental and somatic dysfunction of lumbar region: Secondary | ICD-10-CM | POA: Diagnosis not present

## 2016-08-22 DIAGNOSIS — M5416 Radiculopathy, lumbar region: Secondary | ICD-10-CM | POA: Diagnosis not present

## 2016-08-24 DIAGNOSIS — K582 Mixed irritable bowel syndrome: Secondary | ICD-10-CM | POA: Diagnosis not present

## 2016-08-24 DIAGNOSIS — I480 Paroxysmal atrial fibrillation: Secondary | ICD-10-CM | POA: Diagnosis not present

## 2016-08-24 DIAGNOSIS — E119 Type 2 diabetes mellitus without complications: Secondary | ICD-10-CM | POA: Diagnosis not present

## 2016-08-24 DIAGNOSIS — F419 Anxiety disorder, unspecified: Secondary | ICD-10-CM | POA: Diagnosis not present

## 2016-08-24 DIAGNOSIS — I10 Essential (primary) hypertension: Secondary | ICD-10-CM | POA: Diagnosis not present

## 2016-08-31 DIAGNOSIS — M9905 Segmental and somatic dysfunction of pelvic region: Secondary | ICD-10-CM | POA: Diagnosis not present

## 2016-08-31 DIAGNOSIS — M62830 Muscle spasm of back: Secondary | ICD-10-CM | POA: Diagnosis not present

## 2016-08-31 DIAGNOSIS — M5416 Radiculopathy, lumbar region: Secondary | ICD-10-CM | POA: Diagnosis not present

## 2016-08-31 DIAGNOSIS — M9903 Segmental and somatic dysfunction of lumbar region: Secondary | ICD-10-CM | POA: Diagnosis not present

## 2016-08-31 DIAGNOSIS — M9902 Segmental and somatic dysfunction of thoracic region: Secondary | ICD-10-CM | POA: Diagnosis not present

## 2016-10-30 DIAGNOSIS — M545 Low back pain: Secondary | ICD-10-CM | POA: Diagnosis not present

## 2016-11-03 ENCOUNTER — Other Ambulatory Visit: Payer: Self-pay | Admitting: Internal Medicine

## 2016-11-03 DIAGNOSIS — M5136 Other intervertebral disc degeneration, lumbar region: Secondary | ICD-10-CM

## 2016-11-03 DIAGNOSIS — M549 Dorsalgia, unspecified: Secondary | ICD-10-CM

## 2016-11-14 DIAGNOSIS — I493 Ventricular premature depolarization: Secondary | ICD-10-CM | POA: Diagnosis not present

## 2016-11-14 DIAGNOSIS — M5136 Other intervertebral disc degeneration, lumbar region: Secondary | ICD-10-CM | POA: Diagnosis not present

## 2016-11-14 DIAGNOSIS — I480 Paroxysmal atrial fibrillation: Secondary | ICD-10-CM | POA: Diagnosis not present

## 2016-11-14 DIAGNOSIS — F411 Generalized anxiety disorder: Secondary | ICD-10-CM | POA: Diagnosis not present

## 2016-11-14 DIAGNOSIS — E119 Type 2 diabetes mellitus without complications: Secondary | ICD-10-CM | POA: Diagnosis not present

## 2016-11-14 DIAGNOSIS — I10 Essential (primary) hypertension: Secondary | ICD-10-CM | POA: Diagnosis not present

## 2016-11-15 ENCOUNTER — Other Ambulatory Visit: Payer: Medicare Other

## 2016-11-18 ENCOUNTER — Ambulatory Visit
Admission: RE | Admit: 2016-11-18 | Discharge: 2016-11-18 | Disposition: A | Discharge status: Home / Self Care | Payer: Medicare Other | Source: Ambulatory Visit | Attending: Internal Medicine | Admitting: Internal Medicine

## 2016-11-18 DIAGNOSIS — M5127 Other intervertebral disc displacement, lumbosacral region: Secondary | ICD-10-CM | POA: Diagnosis not present

## 2016-11-18 DIAGNOSIS — M5136 Other intervertebral disc degeneration, lumbar region: Secondary | ICD-10-CM

## 2016-11-18 DIAGNOSIS — M549 Dorsalgia, unspecified: Secondary | ICD-10-CM

## 2016-11-21 ENCOUNTER — Inpatient Hospital Stay
Admission: RE | Admit: 2016-11-21 | Discharge: 2016-11-21 | Disposition: A | Discharge status: Home / Self Care | Payer: Medicare Other | Source: Ambulatory Visit | Attending: Internal Medicine | Admitting: Internal Medicine

## 2016-11-21 DIAGNOSIS — M47816 Spondylosis without myelopathy or radiculopathy, lumbar region: Secondary | ICD-10-CM | POA: Diagnosis not present

## 2016-11-22 DIAGNOSIS — I480 Paroxysmal atrial fibrillation: Secondary | ICD-10-CM | POA: Diagnosis not present

## 2016-11-22 DIAGNOSIS — I493 Ventricular premature depolarization: Secondary | ICD-10-CM | POA: Diagnosis not present

## 2016-12-14 DIAGNOSIS — E119 Type 2 diabetes mellitus without complications: Secondary | ICD-10-CM | POA: Diagnosis not present

## 2016-12-14 DIAGNOSIS — F411 Generalized anxiety disorder: Secondary | ICD-10-CM | POA: Diagnosis not present

## 2016-12-14 DIAGNOSIS — I493 Ventricular premature depolarization: Secondary | ICD-10-CM | POA: Diagnosis not present

## 2016-12-14 DIAGNOSIS — I10 Essential (primary) hypertension: Secondary | ICD-10-CM | POA: Diagnosis not present

## 2016-12-14 DIAGNOSIS — I480 Paroxysmal atrial fibrillation: Secondary | ICD-10-CM | POA: Diagnosis not present

## 2017-01-26 DIAGNOSIS — L570 Actinic keratosis: Secondary | ICD-10-CM | POA: Diagnosis not present

## 2017-01-26 DIAGNOSIS — L820 Inflamed seborrheic keratosis: Secondary | ICD-10-CM | POA: Diagnosis not present

## 2017-01-29 DIAGNOSIS — C44329 Squamous cell carcinoma of skin of other parts of face: Secondary | ICD-10-CM | POA: Diagnosis not present

## 2017-01-29 DIAGNOSIS — L820 Inflamed seborrheic keratosis: Secondary | ICD-10-CM | POA: Diagnosis not present

## 2017-01-29 DIAGNOSIS — D2239 Melanocytic nevi of other parts of face: Secondary | ICD-10-CM | POA: Diagnosis not present

## 2017-01-29 DIAGNOSIS — L821 Other seborrheic keratosis: Secondary | ICD-10-CM | POA: Diagnosis not present

## 2017-02-01 DIAGNOSIS — D0439 Carcinoma in situ of skin of other parts of face: Secondary | ICD-10-CM | POA: Diagnosis not present

## 2017-03-05 DIAGNOSIS — L209 Atopic dermatitis, unspecified: Secondary | ICD-10-CM | POA: Diagnosis not present

## 2017-03-05 DIAGNOSIS — D0439 Carcinoma in situ of skin of other parts of face: Secondary | ICD-10-CM | POA: Diagnosis not present

## 2017-03-05 DIAGNOSIS — L219 Seborrheic dermatitis, unspecified: Secondary | ICD-10-CM | POA: Diagnosis not present

## 2017-03-06 DIAGNOSIS — M9903 Segmental and somatic dysfunction of lumbar region: Secondary | ICD-10-CM | POA: Diagnosis not present

## 2017-03-06 DIAGNOSIS — M9902 Segmental and somatic dysfunction of thoracic region: Secondary | ICD-10-CM | POA: Diagnosis not present

## 2017-03-06 DIAGNOSIS — M62830 Muscle spasm of back: Secondary | ICD-10-CM | POA: Diagnosis not present

## 2017-03-06 DIAGNOSIS — M9905 Segmental and somatic dysfunction of pelvic region: Secondary | ICD-10-CM | POA: Diagnosis not present

## 2017-03-06 DIAGNOSIS — M5416 Radiculopathy, lumbar region: Secondary | ICD-10-CM | POA: Diagnosis not present

## 2017-03-13 DIAGNOSIS — M62830 Muscle spasm of back: Secondary | ICD-10-CM | POA: Diagnosis not present

## 2017-03-13 DIAGNOSIS — M9905 Segmental and somatic dysfunction of pelvic region: Secondary | ICD-10-CM | POA: Diagnosis not present

## 2017-03-13 DIAGNOSIS — M9903 Segmental and somatic dysfunction of lumbar region: Secondary | ICD-10-CM | POA: Diagnosis not present

## 2017-03-13 DIAGNOSIS — S335XXA Sprain of ligaments of lumbar spine, initial encounter: Secondary | ICD-10-CM | POA: Diagnosis not present

## 2017-03-13 DIAGNOSIS — M9902 Segmental and somatic dysfunction of thoracic region: Secondary | ICD-10-CM | POA: Diagnosis not present

## 2017-03-14 DIAGNOSIS — R39198 Other difficulties with micturition: Secondary | ICD-10-CM | POA: Diagnosis not present

## 2017-03-15 DIAGNOSIS — L820 Inflamed seborrheic keratosis: Secondary | ICD-10-CM | POA: Diagnosis not present

## 2017-03-15 DIAGNOSIS — D0439 Carcinoma in situ of skin of other parts of face: Secondary | ICD-10-CM | POA: Diagnosis not present

## 2017-03-20 DIAGNOSIS — M549 Dorsalgia, unspecified: Secondary | ICD-10-CM | POA: Diagnosis not present

## 2017-03-20 DIAGNOSIS — I10 Essential (primary) hypertension: Secondary | ICD-10-CM | POA: Diagnosis not present

## 2017-03-20 DIAGNOSIS — M47816 Spondylosis without myelopathy or radiculopathy, lumbar region: Secondary | ICD-10-CM | POA: Diagnosis not present

## 2017-03-20 DIAGNOSIS — M546 Pain in thoracic spine: Secondary | ICD-10-CM | POA: Diagnosis not present

## 2017-03-20 DIAGNOSIS — I480 Paroxysmal atrial fibrillation: Secondary | ICD-10-CM | POA: Diagnosis not present

## 2017-03-20 DIAGNOSIS — M40202 Unspecified kyphosis, cervical region: Secondary | ICD-10-CM | POA: Diagnosis not present

## 2017-03-20 DIAGNOSIS — E119 Type 2 diabetes mellitus without complications: Secondary | ICD-10-CM | POA: Diagnosis not present

## 2017-03-20 DIAGNOSIS — Z125 Encounter for screening for malignant neoplasm of prostate: Secondary | ICD-10-CM | POA: Diagnosis not present

## 2017-03-20 DIAGNOSIS — F419 Anxiety disorder, unspecified: Secondary | ICD-10-CM | POA: Diagnosis not present

## 2017-03-20 DIAGNOSIS — M542 Cervicalgia: Secondary | ICD-10-CM | POA: Diagnosis not present

## 2017-03-20 DIAGNOSIS — K582 Mixed irritable bowel syndrome: Secondary | ICD-10-CM | POA: Diagnosis not present

## 2017-03-20 DIAGNOSIS — R3121 Asymptomatic microscopic hematuria: Secondary | ICD-10-CM | POA: Diagnosis not present

## 2017-03-26 DIAGNOSIS — M48061 Spinal stenosis, lumbar region without neurogenic claudication: Secondary | ICD-10-CM | POA: Diagnosis not present

## 2017-03-26 DIAGNOSIS — M47816 Spondylosis without myelopathy or radiculopathy, lumbar region: Secondary | ICD-10-CM | POA: Diagnosis not present

## 2017-03-26 DIAGNOSIS — M5136 Other intervertebral disc degeneration, lumbar region: Secondary | ICD-10-CM | POA: Diagnosis not present

## 2017-03-26 DIAGNOSIS — I700 Atherosclerosis of aorta: Secondary | ICD-10-CM | POA: Diagnosis not present

## 2017-04-19 DIAGNOSIS — I4891 Unspecified atrial fibrillation: Secondary | ICD-10-CM | POA: Diagnosis not present

## 2017-04-19 DIAGNOSIS — Z1331 Encounter for screening for depression: Secondary | ICD-10-CM | POA: Diagnosis not present

## 2017-04-19 DIAGNOSIS — Z1389 Encounter for screening for other disorder: Secondary | ICD-10-CM | POA: Diagnosis not present

## 2017-04-19 DIAGNOSIS — E119 Type 2 diabetes mellitus without complications: Secondary | ICD-10-CM | POA: Diagnosis not present

## 2017-04-19 DIAGNOSIS — F411 Generalized anxiety disorder: Secondary | ICD-10-CM | POA: Diagnosis not present

## 2017-04-19 DIAGNOSIS — I10 Essential (primary) hypertension: Secondary | ICD-10-CM | POA: Diagnosis not present

## 2017-04-19 DIAGNOSIS — K582 Mixed irritable bowel syndrome: Secondary | ICD-10-CM | POA: Diagnosis not present

## 2017-04-19 DIAGNOSIS — Z0001 Encounter for general adult medical examination with abnormal findings: Secondary | ICD-10-CM | POA: Diagnosis not present

## 2017-04-19 DIAGNOSIS — R319 Hematuria, unspecified: Secondary | ICD-10-CM | POA: Diagnosis not present

## 2017-05-01 DIAGNOSIS — Z1211 Encounter for screening for malignant neoplasm of colon: Secondary | ICD-10-CM | POA: Diagnosis not present

## 2017-05-01 DIAGNOSIS — R3121 Asymptomatic microscopic hematuria: Secondary | ICD-10-CM | POA: Diagnosis not present

## 2017-05-01 DIAGNOSIS — N281 Cyst of kidney, acquired: Secondary | ICD-10-CM | POA: Diagnosis not present

## 2017-05-01 DIAGNOSIS — N201 Calculus of ureter: Secondary | ICD-10-CM | POA: Diagnosis not present

## 2017-05-18 DIAGNOSIS — L219 Seborrheic dermatitis, unspecified: Secondary | ICD-10-CM | POA: Diagnosis not present

## 2017-05-18 DIAGNOSIS — L821 Other seborrheic keratosis: Secondary | ICD-10-CM | POA: Diagnosis not present

## 2017-05-18 DIAGNOSIS — L299 Pruritus, unspecified: Secondary | ICD-10-CM | POA: Diagnosis not present

## 2017-05-29 DIAGNOSIS — D485 Neoplasm of uncertain behavior of skin: Secondary | ICD-10-CM | POA: Diagnosis not present

## 2017-05-29 DIAGNOSIS — L821 Other seborrheic keratosis: Secondary | ICD-10-CM | POA: Diagnosis not present

## 2017-05-29 DIAGNOSIS — L219 Seborrheic dermatitis, unspecified: Secondary | ICD-10-CM | POA: Diagnosis not present

## 2017-05-29 DIAGNOSIS — L820 Inflamed seborrheic keratosis: Secondary | ICD-10-CM | POA: Diagnosis not present

## 2017-05-29 DIAGNOSIS — D2239 Melanocytic nevi of other parts of face: Secondary | ICD-10-CM | POA: Diagnosis not present

## 2017-06-14 DIAGNOSIS — R682 Dry mouth, unspecified: Secondary | ICD-10-CM | POA: Diagnosis not present

## 2017-06-14 DIAGNOSIS — R0602 Shortness of breath: Secondary | ICD-10-CM | POA: Diagnosis not present

## 2017-06-14 DIAGNOSIS — F411 Generalized anxiety disorder: Secondary | ICD-10-CM | POA: Diagnosis not present

## 2017-06-14 DIAGNOSIS — R0789 Other chest pain: Secondary | ICD-10-CM | POA: Diagnosis not present

## 2017-06-20 DIAGNOSIS — K582 Mixed irritable bowel syndrome: Secondary | ICD-10-CM | POA: Diagnosis not present

## 2017-06-20 DIAGNOSIS — F411 Generalized anxiety disorder: Secondary | ICD-10-CM | POA: Diagnosis not present

## 2017-06-20 DIAGNOSIS — M5136 Other intervertebral disc degeneration, lumbar region: Secondary | ICD-10-CM | POA: Diagnosis not present

## 2017-06-20 DIAGNOSIS — I10 Essential (primary) hypertension: Secondary | ICD-10-CM | POA: Diagnosis not present

## 2017-06-20 DIAGNOSIS — E1169 Type 2 diabetes mellitus with other specified complication: Secondary | ICD-10-CM | POA: Diagnosis not present

## 2017-06-20 DIAGNOSIS — I480 Paroxysmal atrial fibrillation: Secondary | ICD-10-CM | POA: Diagnosis not present

## 2017-07-23 DIAGNOSIS — D225 Melanocytic nevi of trunk: Secondary | ICD-10-CM | POA: Diagnosis not present

## 2017-07-23 DIAGNOSIS — L570 Actinic keratosis: Secondary | ICD-10-CM | POA: Diagnosis not present

## 2017-07-23 DIAGNOSIS — D1801 Hemangioma of skin and subcutaneous tissue: Secondary | ICD-10-CM | POA: Diagnosis not present

## 2017-07-23 DIAGNOSIS — L821 Other seborrheic keratosis: Secondary | ICD-10-CM | POA: Diagnosis not present

## 2017-07-23 DIAGNOSIS — D485 Neoplasm of uncertain behavior of skin: Secondary | ICD-10-CM | POA: Diagnosis not present

## 2017-07-23 DIAGNOSIS — D2239 Melanocytic nevi of other parts of face: Secondary | ICD-10-CM | POA: Diagnosis not present

## 2017-08-13 DIAGNOSIS — M7661 Achilles tendinitis, right leg: Secondary | ICD-10-CM | POA: Diagnosis not present

## 2017-08-17 DIAGNOSIS — Z961 Presence of intraocular lens: Secondary | ICD-10-CM | POA: Diagnosis not present

## 2017-08-17 DIAGNOSIS — E119 Type 2 diabetes mellitus without complications: Secondary | ICD-10-CM | POA: Diagnosis not present

## 2017-09-10 DIAGNOSIS — I781 Nevus, non-neoplastic: Secondary | ICD-10-CM | POA: Diagnosis not present

## 2017-09-10 DIAGNOSIS — L209 Atopic dermatitis, unspecified: Secondary | ICD-10-CM | POA: Diagnosis not present

## 2017-09-20 DIAGNOSIS — Z79899 Other long term (current) drug therapy: Secondary | ICD-10-CM | POA: Diagnosis not present

## 2017-09-20 DIAGNOSIS — E119 Type 2 diabetes mellitus without complications: Secondary | ICD-10-CM | POA: Diagnosis not present

## 2017-09-20 DIAGNOSIS — E669 Obesity, unspecified: Secondary | ICD-10-CM | POA: Diagnosis not present

## 2017-09-20 DIAGNOSIS — Z6834 Body mass index (BMI) 34.0-34.9, adult: Secondary | ICD-10-CM | POA: Diagnosis not present

## 2017-09-20 DIAGNOSIS — L209 Atopic dermatitis, unspecified: Secondary | ICD-10-CM | POA: Diagnosis not present

## 2017-09-20 DIAGNOSIS — I480 Paroxysmal atrial fibrillation: Secondary | ICD-10-CM | POA: Diagnosis not present

## 2017-09-20 DIAGNOSIS — F419 Anxiety disorder, unspecified: Secondary | ICD-10-CM | POA: Diagnosis not present

## 2017-09-20 DIAGNOSIS — M7661 Achilles tendinitis, right leg: Secondary | ICD-10-CM | POA: Diagnosis not present

## 2017-09-20 DIAGNOSIS — I10 Essential (primary) hypertension: Secondary | ICD-10-CM | POA: Diagnosis not present

## 2017-10-15 ENCOUNTER — Telehealth: Payer: Self-pay | Admitting: Gastroenterology

## 2017-10-15 ENCOUNTER — Encounter: Payer: Self-pay | Admitting: Gastroenterology

## 2017-10-15 DIAGNOSIS — M7731 Calcaneal spur, right foot: Secondary | ICD-10-CM | POA: Diagnosis not present

## 2017-10-15 DIAGNOSIS — K589 Irritable bowel syndrome without diarrhea: Secondary | ICD-10-CM | POA: Diagnosis not present

## 2017-10-15 NOTE — Telephone Encounter (Signed)
Left message for pt to call back.  Pt states he has been using benefiber and eating the fiber one protein bars. Now pt is c/o lots of bloating and gas along with constipation. Pt would like an xray to make sure it is just IBS. Explained we cannot order an xray until he is seen at [facility] but he may cut back on the fiber as that causes lots of gas and bloating, use gax-x, and take miralax 2-3 doses/day(titrate as needed) to help until his OV. Pt verbalized understanding.

## 2017-10-17 DIAGNOSIS — R1084 Generalized abdominal pain: Secondary | ICD-10-CM | POA: Diagnosis not present

## 2017-10-17 DIAGNOSIS — K5900 Constipation, unspecified: Secondary | ICD-10-CM | POA: Diagnosis not present

## 2017-10-19 DIAGNOSIS — I480 Paroxysmal atrial fibrillation: Secondary | ICD-10-CM | POA: Diagnosis not present

## 2017-10-22 ENCOUNTER — Ambulatory Visit: Payer: Medicare Other | Admitting: Gastroenterology

## 2017-10-29 DIAGNOSIS — C44311 Basal cell carcinoma of skin of nose: Secondary | ICD-10-CM | POA: Diagnosis not present

## 2017-10-29 DIAGNOSIS — L821 Other seborrheic keratosis: Secondary | ICD-10-CM | POA: Diagnosis not present

## 2017-11-05 DIAGNOSIS — L219 Seborrheic dermatitis, unspecified: Secondary | ICD-10-CM | POA: Diagnosis not present

## 2017-11-05 DIAGNOSIS — C44311 Basal cell carcinoma of skin of nose: Secondary | ICD-10-CM | POA: Diagnosis not present

## 2017-11-16 DIAGNOSIS — I10 Essential (primary) hypertension: Secondary | ICD-10-CM | POA: Diagnosis not present

## 2017-11-16 DIAGNOSIS — K582 Mixed irritable bowel syndrome: Secondary | ICD-10-CM | POA: Diagnosis not present

## 2017-11-16 DIAGNOSIS — E669 Obesity, unspecified: Secondary | ICD-10-CM | POA: Diagnosis not present

## 2017-11-16 DIAGNOSIS — E119 Type 2 diabetes mellitus without complications: Secondary | ICD-10-CM | POA: Diagnosis not present

## 2017-11-16 DIAGNOSIS — I480 Paroxysmal atrial fibrillation: Secondary | ICD-10-CM | POA: Diagnosis not present

## 2017-11-16 DIAGNOSIS — Z6832 Body mass index (BMI) 32.0-32.9, adult: Secondary | ICD-10-CM | POA: Diagnosis not present

## 2017-11-16 DIAGNOSIS — R079 Chest pain, unspecified: Secondary | ICD-10-CM | POA: Diagnosis not present

## 2017-11-16 DIAGNOSIS — F411 Generalized anxiety disorder: Secondary | ICD-10-CM | POA: Diagnosis not present

## 2017-12-04 ENCOUNTER — Other Ambulatory Visit: Payer: Self-pay

## 2017-12-05 DIAGNOSIS — I4811 Longstanding persistent atrial fibrillation: Secondary | ICD-10-CM | POA: Insufficient documentation

## 2017-12-05 DIAGNOSIS — I480 Paroxysmal atrial fibrillation: Secondary | ICD-10-CM

## 2017-12-05 DIAGNOSIS — I493 Ventricular premature depolarization: Secondary | ICD-10-CM

## 2017-12-05 HISTORY — DX: Ventricular premature depolarization: I49.3

## 2017-12-05 HISTORY — DX: Paroxysmal atrial fibrillation: I48.0

## 2017-12-07 DIAGNOSIS — I4811 Longstanding persistent atrial fibrillation: Secondary | ICD-10-CM | POA: Diagnosis not present

## 2017-12-07 DIAGNOSIS — E782 Mixed hyperlipidemia: Secondary | ICD-10-CM | POA: Diagnosis not present

## 2017-12-07 DIAGNOSIS — I351 Nonrheumatic aortic (valve) insufficiency: Secondary | ICD-10-CM | POA: Diagnosis not present

## 2017-12-07 DIAGNOSIS — F419 Anxiety disorder, unspecified: Secondary | ICD-10-CM | POA: Insufficient documentation

## 2017-12-07 DIAGNOSIS — I493 Ventricular premature depolarization: Secondary | ICD-10-CM | POA: Diagnosis not present

## 2017-12-07 DIAGNOSIS — I340 Nonrheumatic mitral (valve) insufficiency: Secondary | ICD-10-CM | POA: Insufficient documentation

## 2017-12-07 DIAGNOSIS — I495 Sick sinus syndrome: Secondary | ICD-10-CM | POA: Diagnosis not present

## 2017-12-07 DIAGNOSIS — R079 Chest pain, unspecified: Secondary | ICD-10-CM | POA: Diagnosis not present

## 2017-12-07 DIAGNOSIS — I10 Essential (primary) hypertension: Secondary | ICD-10-CM | POA: Diagnosis not present

## 2017-12-07 HISTORY — DX: Nonrheumatic aortic (valve) insufficiency: I35.1

## 2017-12-07 HISTORY — DX: Sick sinus syndrome: I49.5

## 2017-12-07 HISTORY — DX: Mixed hyperlipidemia: E78.2

## 2017-12-07 HISTORY — DX: Essential (primary) hypertension: I10

## 2017-12-07 HISTORY — DX: Nonrheumatic mitral (valve) insufficiency: I34.0

## 2017-12-07 HISTORY — DX: Chest pain, unspecified: R07.9

## 2017-12-08 DIAGNOSIS — C44311 Basal cell carcinoma of skin of nose: Secondary | ICD-10-CM | POA: Diagnosis not present

## 2017-12-19 ENCOUNTER — Encounter: Payer: Self-pay | Admitting: Cardiology

## 2017-12-19 DIAGNOSIS — Z6832 Body mass index (BMI) 32.0-32.9, adult: Secondary | ICD-10-CM | POA: Diagnosis not present

## 2017-12-19 DIAGNOSIS — I4821 Permanent atrial fibrillation: Secondary | ICD-10-CM | POA: Diagnosis not present

## 2017-12-19 DIAGNOSIS — I10 Essential (primary) hypertension: Secondary | ICD-10-CM | POA: Diagnosis not present

## 2017-12-19 DIAGNOSIS — F411 Generalized anxiety disorder: Secondary | ICD-10-CM | POA: Diagnosis not present

## 2017-12-19 DIAGNOSIS — E669 Obesity, unspecified: Secondary | ICD-10-CM | POA: Diagnosis not present

## 2017-12-19 DIAGNOSIS — E1169 Type 2 diabetes mellitus with other specified complication: Secondary | ICD-10-CM | POA: Diagnosis not present

## 2017-12-20 DIAGNOSIS — I340 Nonrheumatic mitral (valve) insufficiency: Secondary | ICD-10-CM | POA: Diagnosis not present

## 2017-12-20 DIAGNOSIS — R001 Bradycardia, unspecified: Secondary | ICD-10-CM | POA: Diagnosis not present

## 2017-12-20 DIAGNOSIS — I495 Sick sinus syndrome: Secondary | ICD-10-CM | POA: Diagnosis not present

## 2017-12-20 DIAGNOSIS — E782 Mixed hyperlipidemia: Secondary | ICD-10-CM | POA: Diagnosis not present

## 2017-12-20 DIAGNOSIS — I351 Nonrheumatic aortic (valve) insufficiency: Secondary | ICD-10-CM | POA: Diagnosis not present

## 2017-12-20 DIAGNOSIS — R079 Chest pain, unspecified: Secondary | ICD-10-CM | POA: Diagnosis not present

## 2017-12-20 DIAGNOSIS — I493 Ventricular premature depolarization: Secondary | ICD-10-CM | POA: Diagnosis not present

## 2017-12-20 DIAGNOSIS — F419 Anxiety disorder, unspecified: Secondary | ICD-10-CM | POA: Diagnosis not present

## 2017-12-20 DIAGNOSIS — I10 Essential (primary) hypertension: Secondary | ICD-10-CM | POA: Diagnosis not present

## 2017-12-20 DIAGNOSIS — I4811 Longstanding persistent atrial fibrillation: Secondary | ICD-10-CM | POA: Diagnosis not present

## 2018-01-07 DIAGNOSIS — I10 Essential (primary) hypertension: Secondary | ICD-10-CM | POA: Diagnosis not present

## 2018-01-07 DIAGNOSIS — K5900 Constipation, unspecified: Secondary | ICD-10-CM | POA: Diagnosis not present

## 2018-01-07 DIAGNOSIS — E1169 Type 2 diabetes mellitus with other specified complication: Secondary | ICD-10-CM | POA: Diagnosis not present

## 2018-01-07 DIAGNOSIS — Z6832 Body mass index (BMI) 32.0-32.9, adult: Secondary | ICD-10-CM | POA: Diagnosis not present

## 2018-01-07 DIAGNOSIS — F419 Anxiety disorder, unspecified: Secondary | ICD-10-CM | POA: Diagnosis not present

## 2018-01-07 DIAGNOSIS — I4821 Permanent atrial fibrillation: Secondary | ICD-10-CM | POA: Diagnosis not present

## 2018-01-25 ENCOUNTER — Telehealth: Payer: Self-pay

## 2018-01-25 NOTE — Telephone Encounter (Signed)
SENT REFERRAL TO SCHEDULING AND FILED NOTES 

## 2018-01-29 ENCOUNTER — Encounter: Payer: Self-pay | Admitting: *Deleted

## 2018-02-04 ENCOUNTER — Encounter: Payer: Self-pay | Admitting: Cardiology

## 2018-02-04 ENCOUNTER — Ambulatory Visit (INDEPENDENT_AMBULATORY_CARE_PROVIDER_SITE_OTHER): Payer: Medicare Other | Admitting: Cardiology

## 2018-02-04 VITALS — BP 124/64 | HR 63 | Ht 68.0 in | Wt 236.0 lb

## 2018-02-04 DIAGNOSIS — I4821 Permanent atrial fibrillation: Secondary | ICD-10-CM

## 2018-02-04 DIAGNOSIS — I495 Sick sinus syndrome: Secondary | ICD-10-CM

## 2018-02-04 DIAGNOSIS — Z79899 Other long term (current) drug therapy: Secondary | ICD-10-CM

## 2018-02-04 DIAGNOSIS — I10 Essential (primary) hypertension: Secondary | ICD-10-CM | POA: Diagnosis not present

## 2018-02-04 MED ORDER — APIXABAN 5 MG PO TABS: 5.0000 mg | ORAL_TABLET | Freq: Two times a day (BID) | ORAL | 6 refills | Status: DC

## 2018-02-04 NOTE — Addendum Note (Signed)
Addended by: Stanton Kidney on: 02/04/2018 12:54 PM   Modules accepted: Orders

## 2018-02-04 NOTE — Progress Notes (Signed)
Electrophysiology Office Note   Date:  02/04/2018   ID:  AMEDIO BOWLBY, DOB 07/27/1930, MRN [id number]  PCP:  Ernestene Kiel, MD  Cardiologist:   Primary Electrophysiologist:  Colleene Swarthout Meredith Leeds, MD    No chief complaint on file.    History of Present Illness: Keith Mccall is a 85 y.o. male who is being seen today for the evaluation of atrial fibrillation at the request of Ernestene Kiel, MD. Presenting today for electrophysiology evaluation.  He has a history of hypertension, diabetes, anxiety, atrial fibrillation, aortic sclerosis, mitral regurgitation.  He presents for follow-up of his atrial fibrillation today.  He has been in atrial fibrillation since 2005.  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  He has not been anticoagulated for many years.  He is unaware of his atrial fibrillation.    Today, he denies symptoms of palpitations, chest pain, shortness of breath, orthopnea, PND, lower extremity edema, claudication, dizziness, presyncope, syncope, bleeding, or neurologic sequela. The patient is tolerating medications without difficulties.    Past Medical History:  Diagnosis Date  . Anxiety   . Atrial fibrillation ([place])   . Chest pain in adult 12/07/2017  . Essential hypertension 12/07/2017  . Mixed hyperlipidemia 12/07/2017  . Nonrheumatic aortic valve insufficiency 12/07/2017  . Nonrheumatic mitral valve regurgitation 12/07/2017  . Paroxysmal A-fib ([place]) 12/05/2017  . PVC (premature ventricular contraction) 12/05/2017  . Sick sinus syndrome ([place]) 12/07/2017   Past Surgical History:  Procedure Laterality Date  . HERNIA REPAIR       Current Outpatient Medications  Medication Sig Dispense Refill  . ACCU-CHEK AVIVA PLUS test strip USE TO CHECK BLOOD SUGAR TWICE A DAY  7  . ALPRAZolam (XANAX) 1 MG tablet Take 1 mg by mouth 2 (two) times daily as needed.      . Cholecalciferol  (VITAMIN D-3) 5000 UNITS TABS Take 1 tablet by mouth daily.      Marland Kitchen Hawthorne Berry 500 MG CAPS Take by mouth at bedtime.      . Lactase (DAIRY DIGESTIVE PO) Take by mouth daily.      . Magnesium 200 MG TABS Take 3 tablets by mouth daily.      . metoprolol tartrate (LOPRESSOR) 25 MG tablet Take 12.5 mg by mouth 2 (two) times daily.      . Omega-3 Fatty Acids (SUPER OMEGA 3 PO) Take by mouth.      . Probiotic Product (PROBIOTIC PO) Take by mouth daily.      . Taurine 1000 MG CAPS Take 2 capsules by mouth daily.      Marland Kitchen Ubiquinol 50 MG CAPS Take by mouth daily.       No current facility-administered medications for this visit.     Allergies:   Amiodarone; Erythromycin base; Polycarbophil; Penicillins; and Sulfa antibiotics   Social History:  The patient  reports that he has never smoked. He has never used smokeless tobacco. He reports that he does not drink alcohol or use drugs.   Family History:  The patient's family history includes Breast cancer in his sister; CAD in his father; Coronary artery disease in his father; Diabetes in his maternal grandmother, mother, and sister; GI Bleed in his sister; Heart attack in his mother; Heart disease in his mother; Hyperlipidemia in his sister; Hypertension in his father; Lung cancer in his mother; Migraines in his child; Stroke in his father.    ROS:  Please  see the history of present illness.   Otherwise, review of systems is positive for none.   All other systems are reviewed and negative.    PHYSICAL EXAM: VS:  BP 124/64   Pulse 63   Ht 5\' 8"  (1.727 m)   Wt 236 lb (107 kg)   BMI 35.88 kg/m  , BMI Body mass index is 35.88 kg/m. GEN: Well nourished, well developed, in no acute distress  HEENT: normal  Neck: no JVD, carotid bruits, or masses Cardiac: iRRR; no murmurs, rubs, or gallops,no edema  Respiratory:  clear to auscultation bilaterally, normal work of breathing GI: soft, nontender, nondistended, + BS MS: no deformity or atrophy  Skin:  warm and dry Neuro:  Strength and sensation are intact Psych: euthymic mood, full affect  EKG:  EKG is ordered today. Personal review of the ekg ordered shows atrial fibrillation, rate 63  Recent Labs: No results found for requested labs within last 8760 hours.    Lipid Panel  No results found for: CHOL, TRIG, HDL, CHOLHDL, VLDL, LDLCALC, LDLDIRECT   Wt Readings from Last 3 Encounters:  02/04/18 236 lb (107 kg)      Other studies Reviewed: Additional studies/ records that were reviewed today include: TTE 12/20/17  Review of the above records today demonstrates:  Moderate mitral annular calcification.  Moderate mitral regurgitation.  There is moderate fibrocalcific sclerosis of the aortic valve with evidence of  mild aortic stenosis with mean gradient across the aortic valve of 10 mmHg and  calculated aortic valve area was 1.26 square centimeters, all suggesting mild  aortic stenosis.  Tricuspid valve is structurally normal.  Mild tricuspid regurgitation.  RVSP 30 mm Hg.  Ejection fraction is visually estimated at 55%  Mild concentric left ventricular hypertrophy  The aortic root diameter is within normal limits.  The IVC is dilated with collapse during inspiration.  Signature    ASSESSMENT AND PLAN:  1.  Permanent atrial fibrillation: Currently well rate controlled on metoprolol.  He is currently not anticoagulated.  We did discuss his stroke risk today.  He understands he is at a high risk of stroke and thus would be amenable to anticoagulation.  We Wilhelm Ganaway start him on Eliquis today.  This patients CHA2DS2-VASc Score and unadjusted Ischemic Stroke Rate (% per year) is equal to 3.2 % stroke rate/year from a score of 3  Above score calculated as 1 point each if present [CHF, HTN, DM, Vascular=MI/PAD/Aortic Plaque, Age if 65-74, or Male] Above score calculated as 2 points each if present [Age > 75, or Stroke/TIA/TE]  2.  Hypertension: Currently well  controlled.  Plan per primary physician.  Case discussed with referring physician     Current medicines are reviewed at length with the patient today.   The patient does not have concerns regarding his medicines.  The following changes were made today:  none  Labs/ tests ordered today include:  Orders Placed This Encounter  Procedures  . CBC  . Basic metabolic panel  . EKG 12-Lead     Disposition:   FU with Alija Riano 3 months  Signed, Kyler Germer Meredith Leeds, MD  02/04/2018 12:31 PM     [facility] [address] [place] [address] [phone] (office) [phone] (fax)

## 2018-02-04 NOTE — Patient Instructions (Addendum)
Medication Instructions:  Your physician has recommended you make the following change in your medication: 1. START Eliquis -- the nurse will call you about starting this medication after your lab work has been reviewed.  * If you need a refill on your cardiac medications before your next appointment, please call your pharmacy.   Labwork: Today: BMET & CBC  *We will only notify you of abnormal results, otherwise continue current treatment plan.  Testing/Procedures: None ordered  Follow-Up: Your physician recommends that you schedule a follow-up appointment in: 3 months with Dr. Curt Bears.  Thank you for choosing [facility]!!   Trinidad Curet, RN [phone]  Any Other Special Instructions Will Be Listed Below (If Applicable).  Apixaban oral tablets What is this medicine? APIXABAN (a PIX a ban) is an anticoagulant (blood thinner). It is used to lower the chance of stroke in people with a medical condition called atrial fibrillation. It is also used to treat or prevent blood clots in the lungs or in the veins. This medicine may be used for other purposes; ask your health care provider or pharmacist if you have questions. COMMON BRAND NAME(S): Eliquis What should I tell my health care provider before I take this medicine? They need to know if you have any of these conditions: -bleeding disorders -bleeding in the brain -blood in your stools (black or tarry stools) or if you have blood in your vomit -history of stomach bleeding -kidney disease -liver disease -mechanical heart valve -an unusual or allergic reaction to apixaban, other medicines, foods, dyes, or preservatives -pregnant or trying to get pregnant -breast-feeding How should I use this medicine? Take this medicine by mouth with a glass of water. Follow the directions on the prescription label. You can take it with or without food. If it upsets your stomach, take it with food. Take your medicine at regular intervals.  Do not take it more often than directed. Do not stop taking except on your doctor's advice. Stopping this medicine may increase your risk of a blood clot. Be sure to refill your prescription before you run out of medicine. Talk to your pediatrician regarding the use of this medicine in children. Special care may be needed. Overdosage: If you think you have taken too much of this medicine contact a poison control center or emergency room at once. NOTE: This medicine is only for you. Do not share this medicine with others. What if I miss a dose? If you miss a dose, take it as soon as you can. If it is almost time for your next dose, take only that dose. Do not take double or extra doses. What may interact with this medicine? This medicine may interact with the following: -aspirin and aspirin-like medicines -certain medicines for fungal infections like ketoconazole and itraconazole -certain medicines for seizures like carbamazepine and phenytoin -certain medicines that treat or prevent blood clots like warfarin, enoxaparin, and dalteparin -clarithromycin -NSAIDs, medicines for pain and inflammation, like ibuprofen or naproxen -rifampin -ritonavir -St. Fuller's wort This list may not describe all possible interactions. Give your health care provider a list of all the medicines, herbs, non-prescription drugs, or dietary supplements you use. Also tell them if you smoke, drink alcohol, or use illegal drugs. Some items may interact with your medicine. What should I watch for while using this medicine? Visit your healthcare professional for regular checks on your progress. You may need blood work done while you are taking this medicine. Your condition will be monitored carefully while you  are receiving this medicine. It is important not to miss any appointments. Avoid sports and activities that might cause injury while you are using this medicine. Severe falls or injuries can cause unseen bleeding. Be  careful when using sharp tools or knives. Consider using an Copy. Take special care brushing or flossing your teeth. Report any injuries, bruising, or red spots on the skin to your healthcare professional. If you are going to need surgery or other procedure, tell your healthcare professional that you are taking this medicine. Wear a medical ID bracelet or chain. Carry a card that describes your disease and details of your medicine and dosage times. What side effects may I notice from receiving this medicine? Side effects that you should report to your doctor or health care professional as soon as possible: -allergic reactions like skin rash, itching or hives, swelling of the face, lips, or tongue -signs and symptoms of bleeding such as bloody or black, tarry stools; red or dark-brown urine; spitting up blood or brown material that looks like coffee grounds; red spots on the skin; unusual bruising or bleeding from the eye, gums, or nose -signs and symptoms of a blood clot such as chest pain; shortness of breath; pain, swelling, or warmth in the leg -signs and symptoms of a stroke such as changes in vision; confusion; trouble speaking or understanding; severe headaches; sudden numbness or weakness of the face, arm or leg; trouble walking; dizziness; loss of coordination This list may not describe all possible side effects. Call your doctor for medical advice about side effects. You may report side effects to FDA at 1-800-FDA-1088. Where should I keep my medicine? Keep out of the reach of children. Store at room temperature between 20 and 25 degrees C (68 and 77 degrees F). Throw away any unused medicine after the expiration date. NOTE: This sheet is a summary. It may not cover all possible information. If you have questions about this medicine, talk to your doctor, pharmacist, or health care provider.  2019 Elsevier/Gold Standard (2016-12-28 11:20:07)

## 2018-02-05 DIAGNOSIS — L719 Rosacea, unspecified: Secondary | ICD-10-CM | POA: Diagnosis not present

## 2018-02-08 ENCOUNTER — Telehealth: Payer: Self-pay | Admitting: Cardiology

## 2018-02-08 NOTE — Telephone Encounter (Signed)
New Message    Calling on behave of patient stating that Dr. Curt Bears needed more information from [facility]

## 2018-02-13 NOTE — Telephone Encounter (Signed)
Returned call to [employer], medical records at [facility]. She reports that they did not order the holter monitor and we would need to get complete monitor result from them. Thanked her for her call and information.

## 2018-02-20 DIAGNOSIS — M79604 Pain in right leg: Secondary | ICD-10-CM | POA: Diagnosis not present

## 2018-02-20 DIAGNOSIS — I4821 Permanent atrial fibrillation: Secondary | ICD-10-CM | POA: Diagnosis not present

## 2018-02-20 DIAGNOSIS — E1169 Type 2 diabetes mellitus with other specified complication: Secondary | ICD-10-CM | POA: Diagnosis not present

## 2018-02-20 DIAGNOSIS — I10 Essential (primary) hypertension: Secondary | ICD-10-CM | POA: Diagnosis not present

## 2018-02-20 DIAGNOSIS — F411 Generalized anxiety disorder: Secondary | ICD-10-CM | POA: Diagnosis not present

## 2018-02-20 DIAGNOSIS — M79605 Pain in left leg: Secondary | ICD-10-CM | POA: Diagnosis not present

## 2018-02-20 DIAGNOSIS — Z6832 Body mass index (BMI) 32.0-32.9, adult: Secondary | ICD-10-CM | POA: Diagnosis not present

## 2018-02-22 ENCOUNTER — Other Ambulatory Visit: Payer: Self-pay

## 2018-03-19 DIAGNOSIS — L719 Rosacea, unspecified: Secondary | ICD-10-CM | POA: Diagnosis not present

## 2018-03-19 DIAGNOSIS — L820 Inflamed seborrheic keratosis: Secondary | ICD-10-CM | POA: Diagnosis not present

## 2018-03-25 DIAGNOSIS — Z6832 Body mass index (BMI) 32.0-32.9, adult: Secondary | ICD-10-CM | POA: Diagnosis not present

## 2018-03-25 DIAGNOSIS — Z79899 Other long term (current) drug therapy: Secondary | ICD-10-CM | POA: Diagnosis not present

## 2018-03-25 DIAGNOSIS — I10 Essential (primary) hypertension: Secondary | ICD-10-CM | POA: Diagnosis not present

## 2018-03-25 DIAGNOSIS — I4821 Permanent atrial fibrillation: Secondary | ICD-10-CM | POA: Diagnosis not present

## 2018-03-25 DIAGNOSIS — E1169 Type 2 diabetes mellitus with other specified complication: Secondary | ICD-10-CM | POA: Diagnosis not present

## 2018-03-25 DIAGNOSIS — F411 Generalized anxiety disorder: Secondary | ICD-10-CM | POA: Diagnosis not present

## 2018-04-06 IMAGING — MR MR LUMBAR SPINE W/O CM
4 of 5 series · 25 of 48 positions shown · non-contrast
Comparison: Lumbar spine radiographs 10/30/2016.

CLINICAL DATA: Chronic low back pain for years without
radiculopathy. Dorsalgia. Degenerative disc disease.

EXAM:
MRI LUMBAR SPINE WITHOUT CONTRAST
TECHNIQUE: Multiplanar, multisequence MR imaging of the lumbar spine was
performed. No intravenous contrast was administered.

[Series 5: T1 · sagittal · 4.0mm · 0.55mm/px · 6 of 15 slices shown (1 of 2)]
[im 1/15]
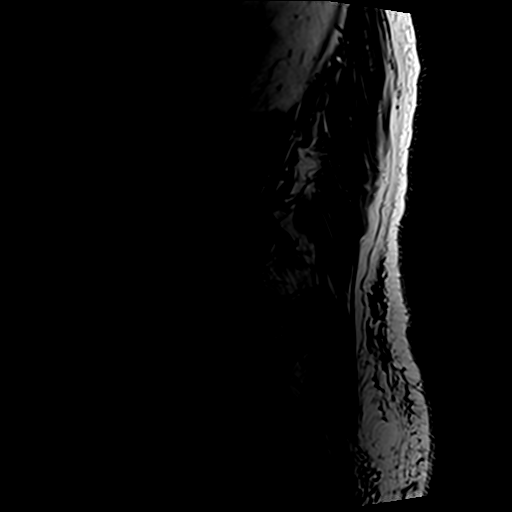
[im 3/15]
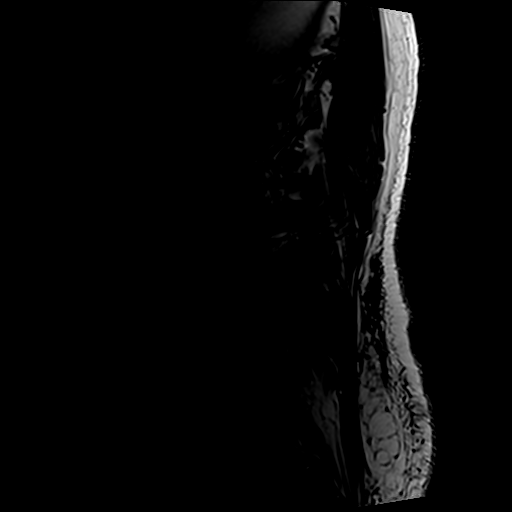
[im 6/15]
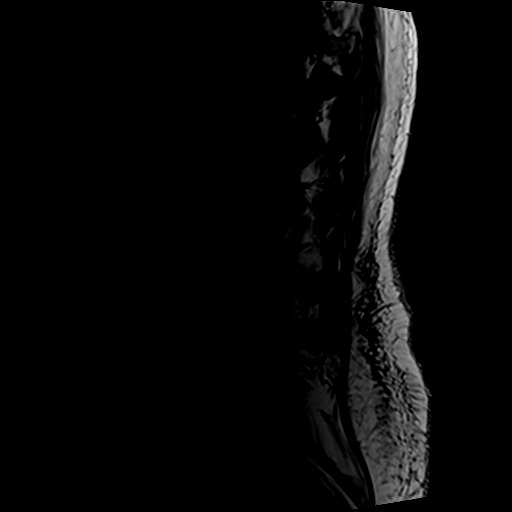
[im 9/15]
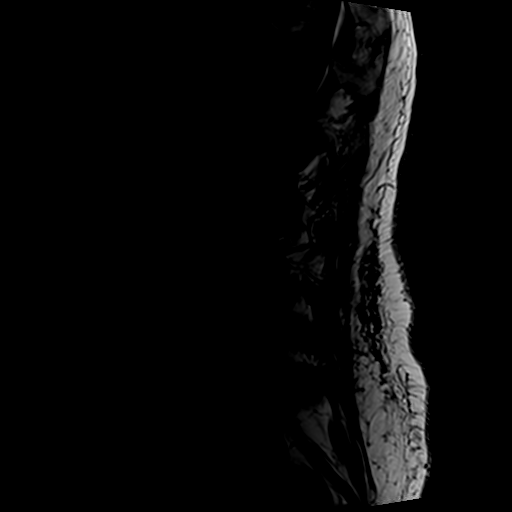
[im 12/15]
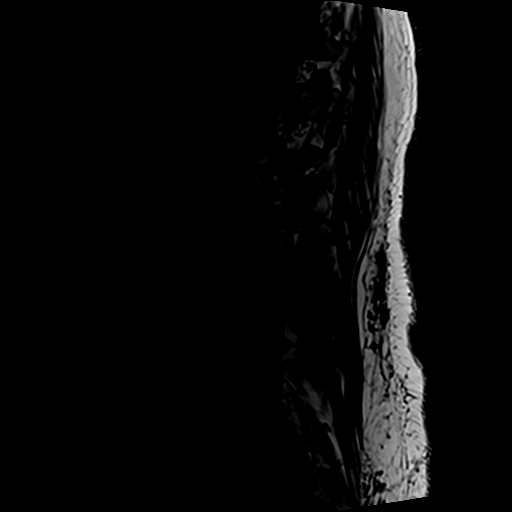
[im 15/15]
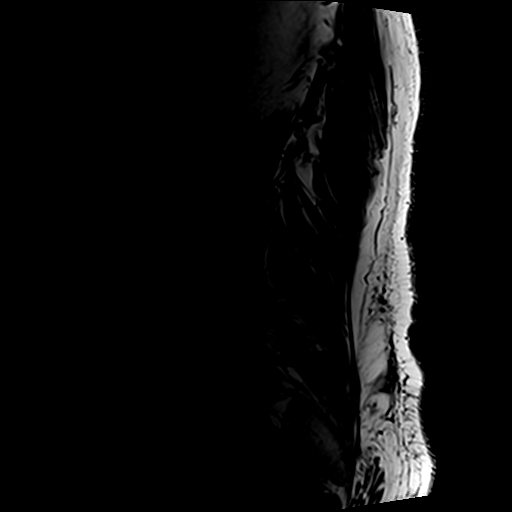

[Series 6: T1 · axial · 4.0mm · 0.35mm/px · z∈[-51,+142]mm · 4 of 40 slices shown (2 of 2)]
[im 1/40]
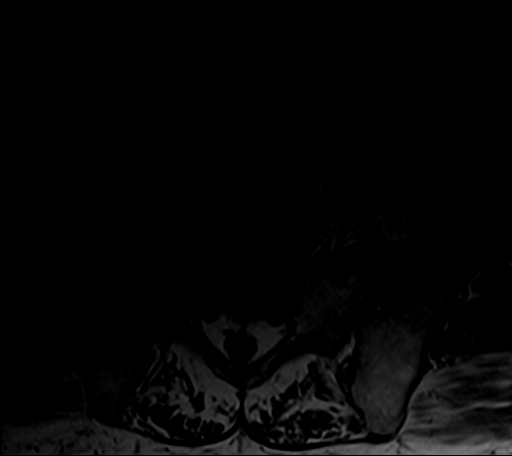
[im 6/40]
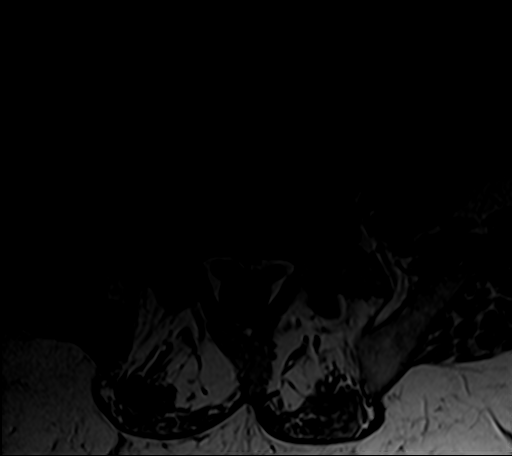
[im 20/40]
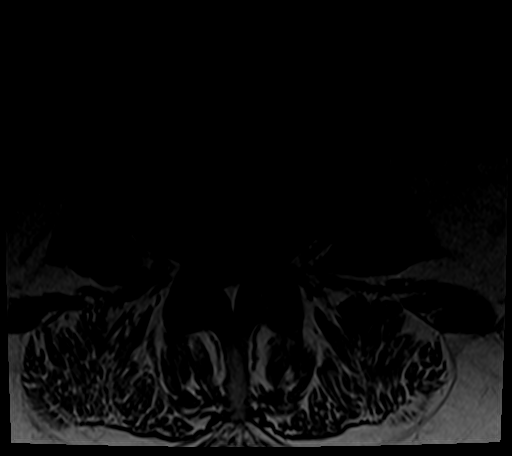
[im 34/40]
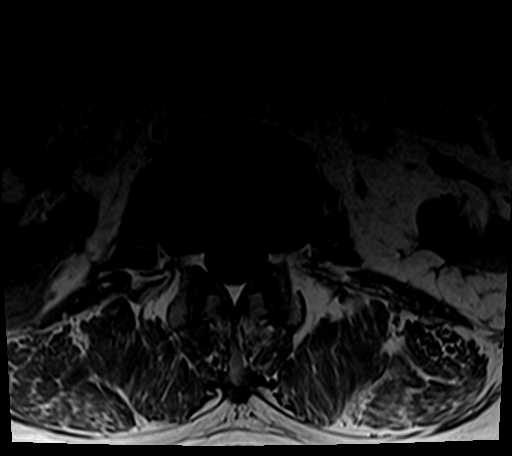

[Series 7: T2 · axial · 4.0mm · 0.70mm/px · z∈[-51,+185]mm · 9 of 40 slices shown]
[im 1/40]
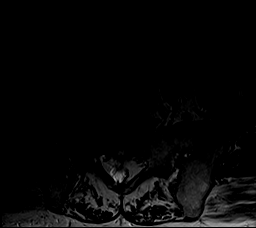
[im 6/40]
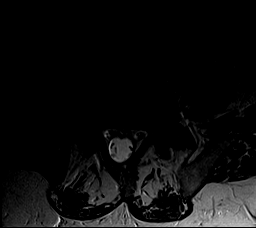
[im 12/40]
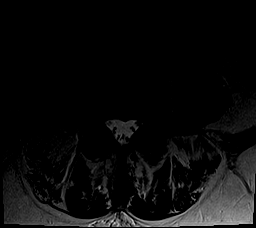
[im 17/40]
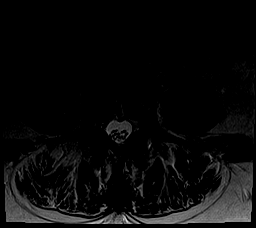
[im 20/40]
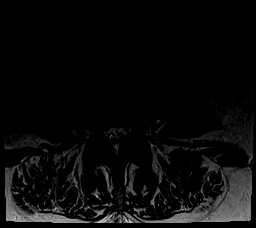
[im 23/40]
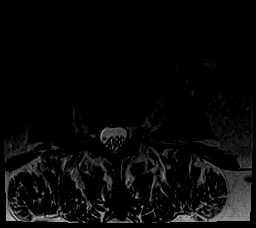
[im 28/40]
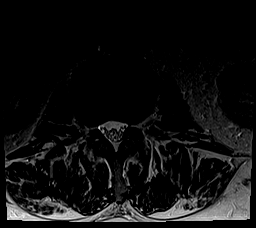
[im 34/40]
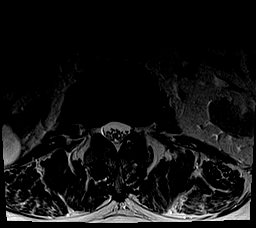
[im 40/40]
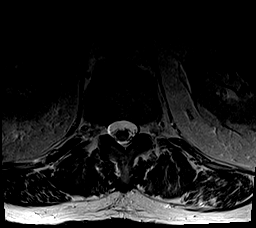

[Series 8: T2 post-contrast · sagittal · 4.0mm · 0.55mm/px · 6 of 15 slices shown]
[im 1/15]
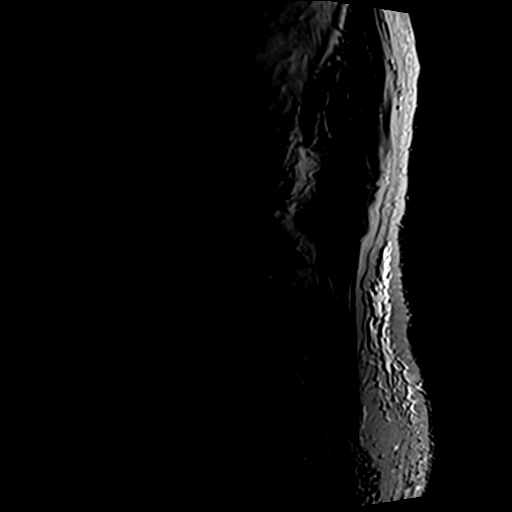
[im 3/15]
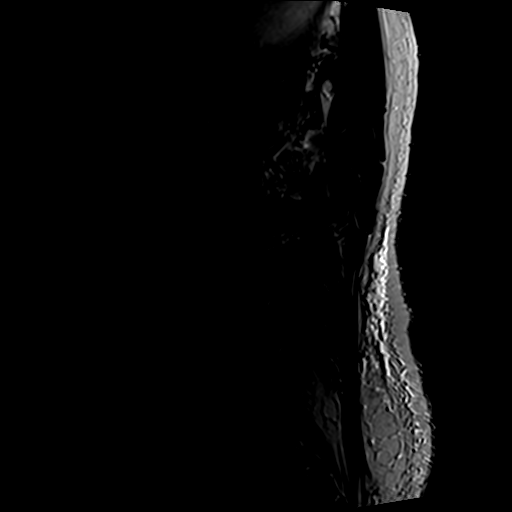
[im 6/15]
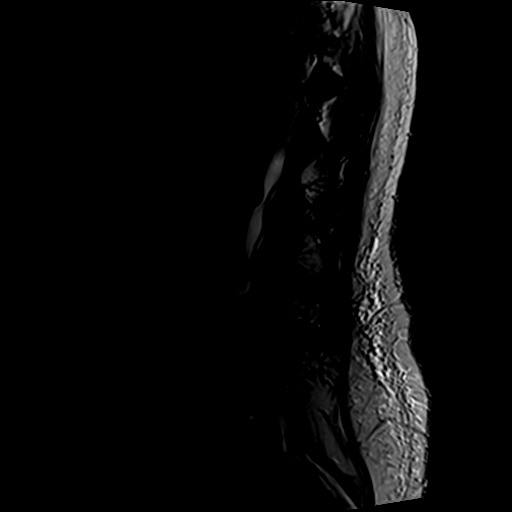
[im 9/15]
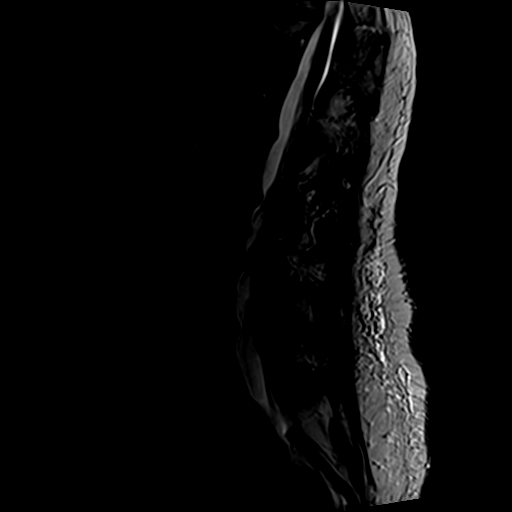
[im 12/15]
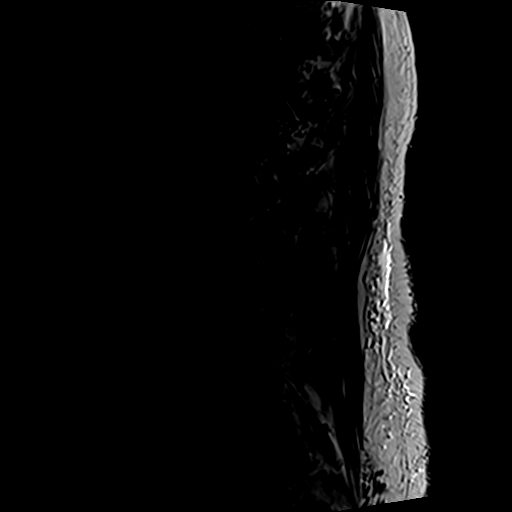
[im 15/15]
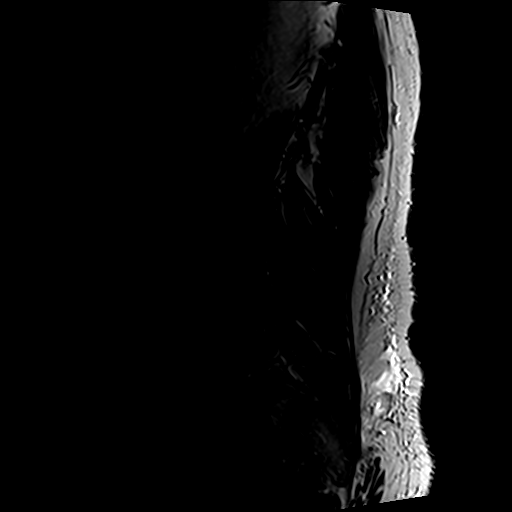

[25 of 48 positions shown; findings below may reference images not displayed]

FINDINGS: Segmentation: 5 non rib-bearing lumbar type vertebral bodies are
present.

Alignment: AP alignment is anatomic. Rightward curvature is centered
at L3.

Vertebrae: A hemangioma is noted inferiorly on the left at L4. A
remote superior endplate fractures present at T12. Marrow signal and
vertebral body heights are otherwise normal.

Conus medullaris: Extends to the L2 level and appears normal.

Paraspinal and other soft tissues: The posterior right renal cyst is
partially imaged. No other focal lesions are present. There is no
significant adenopathy. Atherosclerotic changes are noted in the
aorta without aneurysm.

Disc levels:

L1-2: Mild disc bulging and facet hypertrophy is present. There is
no significant stenosis.

L2-3: Lateral disc protrusions and mild facet hypertrophy are noted.
Mild foraminal narrowing is worse on the right. The central canal is
patent.

L3-4: A broad-based disc protrusion is present. Mild facet
hypertrophy is worse on the right. This leads to mild bilateral
foraminal narrowing.

L4-5: A broad-based disc protrusion is asymmetric to the right. Mild
bilateral subarticular narrowing and moderate bilateral foraminal
stenosis is present.

L5-S1: Facet hypertrophy is worse on the right. This contributes to
moderate right foraminal narrowing. Mild left foraminal narrowing is
present. The central canal is patent.
IMPRESSION: 1. Multilevel spondylosis of the lumbar spine associated with
scoliosis.
2. Atherosclerosis.
3. Mild foraminal narrowing bilaterally at L2-3 is worse on the
right.
4. Mild bilateral foraminal narrowing at L3-4.
5. Mild subarticular and moderate foraminal stenosis bilaterally at
L4-5.
6. Mild left foraminal narrowing at L5-S1.

## 2018-04-18 ENCOUNTER — Telehealth: Payer: Self-pay | Admitting: *Deleted

## 2018-04-18 NOTE — Telephone Encounter (Signed)
Called patient to let them know due to recent [place] and Health Department Protocols, we are not seeing patients in the office. We are instead seeing if they would like to schedule this appointment as a Research scientist (medical) or Laptop. Patient is aware if they decide to reschedule this appointment, they may not be seen or scheduled for the next 4-6 months. Patient at this time declines [employer] Virtual Visits. Due to recent home stressors he would like to reschedule this appointment at a later date. Message sent scheduling and nurse.   Patient currently has no concerns or complaints and would rather come to the office for a 'face to face visit' at a later date.

## 2018-04-23 NOTE — Telephone Encounter (Signed)
Left message with wife for pt to call me back to schedule June OV in [place].

## 2018-04-29 ENCOUNTER — Telehealth: Payer: Medicare Other | Admitting: Cardiology

## 2018-05-06 NOTE — Telephone Encounter (Signed)
Pt scheduled for August in-office visit in [place], per his request.

## 2018-06-01 DIAGNOSIS — L821 Other seborrheic keratosis: Secondary | ICD-10-CM | POA: Diagnosis not present

## 2018-06-01 DIAGNOSIS — L219 Seborrheic dermatitis, unspecified: Secondary | ICD-10-CM | POA: Diagnosis not present

## 2018-06-28 DIAGNOSIS — N390 Urinary tract infection, site not specified: Secondary | ICD-10-CM | POA: Diagnosis not present

## 2018-06-28 DIAGNOSIS — R319 Hematuria, unspecified: Secondary | ICD-10-CM | POA: Diagnosis not present

## 2018-06-28 DIAGNOSIS — Z6832 Body mass index (BMI) 32.0-32.9, adult: Secondary | ICD-10-CM | POA: Diagnosis not present

## 2018-07-04 DIAGNOSIS — E1169 Type 2 diabetes mellitus with other specified complication: Secondary | ICD-10-CM | POA: Diagnosis not present

## 2018-07-04 DIAGNOSIS — I10 Essential (primary) hypertension: Secondary | ICD-10-CM | POA: Diagnosis not present

## 2018-07-04 DIAGNOSIS — F411 Generalized anxiety disorder: Secondary | ICD-10-CM | POA: Diagnosis not present

## 2018-07-04 DIAGNOSIS — N200 Calculus of kidney: Secondary | ICD-10-CM | POA: Diagnosis not present

## 2018-07-04 DIAGNOSIS — Z79899 Other long term (current) drug therapy: Secondary | ICD-10-CM | POA: Diagnosis not present

## 2018-07-04 DIAGNOSIS — M545 Low back pain: Secondary | ICD-10-CM | POA: Diagnosis not present

## 2018-07-04 DIAGNOSIS — I4821 Permanent atrial fibrillation: Secondary | ICD-10-CM | POA: Diagnosis not present

## 2018-07-04 DIAGNOSIS — M5136 Other intervertebral disc degeneration, lumbar region: Secondary | ICD-10-CM | POA: Diagnosis not present

## 2018-07-04 DIAGNOSIS — Z6831 Body mass index (BMI) 31.0-31.9, adult: Secondary | ICD-10-CM | POA: Diagnosis not present

## 2018-07-04 DIAGNOSIS — M47816 Spondylosis without myelopathy or radiculopathy, lumbar region: Secondary | ICD-10-CM | POA: Diagnosis not present

## 2018-07-08 ENCOUNTER — Other Ambulatory Visit: Payer: Self-pay | Admitting: Family

## 2018-07-08 DIAGNOSIS — M545 Low back pain, unspecified: Secondary | ICD-10-CM

## 2018-07-08 DIAGNOSIS — N200 Calculus of kidney: Secondary | ICD-10-CM

## 2018-07-08 DIAGNOSIS — M47816 Spondylosis without myelopathy or radiculopathy, lumbar region: Secondary | ICD-10-CM

## 2018-07-08 DIAGNOSIS — M5136 Other intervertebral disc degeneration, lumbar region: Secondary | ICD-10-CM

## 2018-07-09 ENCOUNTER — Ambulatory Visit
Admission: RE | Admit: 2018-07-09 | Discharge: 2018-07-09 | Disposition: A | Discharge status: Home / Self Care | Payer: Medicare Other | Source: Ambulatory Visit | Attending: Family | Admitting: Family

## 2018-07-09 DIAGNOSIS — N200 Calculus of kidney: Secondary | ICD-10-CM

## 2018-07-16 ENCOUNTER — Other Ambulatory Visit: Payer: Self-pay

## 2018-07-16 ENCOUNTER — Ambulatory Visit
Admission: RE | Admit: 2018-07-16 | Discharge: 2018-07-16 | Disposition: A | Discharge status: Home / Self Care | Payer: Medicare Other | Source: Ambulatory Visit | Attending: Family | Admitting: Family

## 2018-07-16 DIAGNOSIS — M47816 Spondylosis without myelopathy or radiculopathy, lumbar region: Secondary | ICD-10-CM

## 2018-07-16 DIAGNOSIS — M545 Low back pain, unspecified: Secondary | ICD-10-CM

## 2018-07-16 DIAGNOSIS — M5136 Other intervertebral disc degeneration, lumbar region: Secondary | ICD-10-CM

## 2018-07-16 DIAGNOSIS — M48061 Spinal stenosis, lumbar region without neurogenic claudication: Secondary | ICD-10-CM | POA: Diagnosis not present

## 2018-07-16 DIAGNOSIS — M5126 Other intervertebral disc displacement, lumbar region: Secondary | ICD-10-CM | POA: Diagnosis not present

## 2018-07-17 DIAGNOSIS — R319 Hematuria, unspecified: Secondary | ICD-10-CM | POA: Diagnosis not present

## 2018-07-17 DIAGNOSIS — Z1339 Encounter for screening examination for other mental health and behavioral disorders: Secondary | ICD-10-CM | POA: Diagnosis not present

## 2018-07-17 DIAGNOSIS — K582 Mixed irritable bowel syndrome: Secondary | ICD-10-CM | POA: Diagnosis not present

## 2018-07-17 DIAGNOSIS — M47816 Spondylosis without myelopathy or radiculopathy, lumbar region: Secondary | ICD-10-CM | POA: Diagnosis not present

## 2018-07-17 DIAGNOSIS — E1169 Type 2 diabetes mellitus with other specified complication: Secondary | ICD-10-CM | POA: Diagnosis not present

## 2018-07-17 DIAGNOSIS — N200 Calculus of kidney: Secondary | ICD-10-CM | POA: Diagnosis not present

## 2018-07-17 DIAGNOSIS — M5136 Other intervertebral disc degeneration, lumbar region: Secondary | ICD-10-CM | POA: Diagnosis not present

## 2018-07-17 DIAGNOSIS — F411 Generalized anxiety disorder: Secondary | ICD-10-CM | POA: Diagnosis not present

## 2018-07-17 DIAGNOSIS — I4821 Permanent atrial fibrillation: Secondary | ICD-10-CM | POA: Diagnosis not present

## 2018-07-17 DIAGNOSIS — I10 Essential (primary) hypertension: Secondary | ICD-10-CM | POA: Diagnosis not present

## 2018-07-17 DIAGNOSIS — Z0000 Encounter for general adult medical examination without abnormal findings: Secondary | ICD-10-CM | POA: Diagnosis not present

## 2018-07-17 DIAGNOSIS — Z6832 Body mass index (BMI) 32.0-32.9, adult: Secondary | ICD-10-CM | POA: Diagnosis not present

## 2018-07-19 ENCOUNTER — Other Ambulatory Visit: Payer: Medicare Other

## 2018-08-26 ENCOUNTER — Ambulatory Visit: Payer: Medicare Other | Admitting: Cardiology

## 2018-09-19 DIAGNOSIS — E119 Type 2 diabetes mellitus without complications: Secondary | ICD-10-CM | POA: Diagnosis not present

## 2018-09-19 DIAGNOSIS — Z961 Presence of intraocular lens: Secondary | ICD-10-CM | POA: Diagnosis not present

## 2018-10-02 DIAGNOSIS — Z79899 Other long term (current) drug therapy: Secondary | ICD-10-CM | POA: Diagnosis not present

## 2018-10-02 DIAGNOSIS — Z1331 Encounter for screening for depression: Secondary | ICD-10-CM | POA: Diagnosis not present

## 2018-10-02 DIAGNOSIS — I10 Essential (primary) hypertension: Secondary | ICD-10-CM | POA: Diagnosis not present

## 2018-10-02 DIAGNOSIS — N200 Calculus of kidney: Secondary | ICD-10-CM | POA: Diagnosis not present

## 2018-10-02 DIAGNOSIS — E1169 Type 2 diabetes mellitus with other specified complication: Secondary | ICD-10-CM | POA: Diagnosis not present

## 2018-10-02 DIAGNOSIS — Z6832 Body mass index (BMI) 32.0-32.9, adult: Secondary | ICD-10-CM | POA: Diagnosis not present

## 2018-10-02 DIAGNOSIS — I4821 Permanent atrial fibrillation: Secondary | ICD-10-CM | POA: Diagnosis not present

## 2018-11-26 DIAGNOSIS — L570 Actinic keratosis: Secondary | ICD-10-CM | POA: Diagnosis not present

## 2018-11-26 DIAGNOSIS — L209 Atopic dermatitis, unspecified: Secondary | ICD-10-CM | POA: Diagnosis not present

## 2018-12-09 ENCOUNTER — Other Ambulatory Visit: Payer: Self-pay

## 2018-12-09 DIAGNOSIS — L209 Atopic dermatitis, unspecified: Secondary | ICD-10-CM | POA: Diagnosis not present

## 2018-12-09 DIAGNOSIS — C44319 Basal cell carcinoma of skin of other parts of face: Secondary | ICD-10-CM | POA: Diagnosis not present

## 2018-12-09 DIAGNOSIS — L821 Other seborrheic keratosis: Secondary | ICD-10-CM | POA: Diagnosis not present

## 2019-01-21 DIAGNOSIS — I4821 Permanent atrial fibrillation: Secondary | ICD-10-CM | POA: Diagnosis not present

## 2019-01-21 DIAGNOSIS — Z6832 Body mass index (BMI) 32.0-32.9, adult: Secondary | ICD-10-CM | POA: Diagnosis not present

## 2019-02-19 DIAGNOSIS — R350 Frequency of micturition: Secondary | ICD-10-CM | POA: Diagnosis not present

## 2019-02-19 DIAGNOSIS — R3121 Asymptomatic microscopic hematuria: Secondary | ICD-10-CM | POA: Diagnosis not present

## 2019-02-19 DIAGNOSIS — N200 Calculus of kidney: Secondary | ICD-10-CM | POA: Diagnosis not present

## 2019-03-13 ENCOUNTER — Other Ambulatory Visit: Payer: Self-pay | Admitting: Sports Medicine

## 2019-03-13 ENCOUNTER — Other Ambulatory Visit: Payer: Self-pay

## 2019-03-13 ENCOUNTER — Encounter: Payer: Self-pay | Admitting: Sports Medicine

## 2019-03-13 ENCOUNTER — Ambulatory Visit (INDEPENDENT_AMBULATORY_CARE_PROVIDER_SITE_OTHER): Payer: Medicare Other | Admitting: Sports Medicine

## 2019-03-13 DIAGNOSIS — F419 Anxiety disorder, unspecified: Secondary | ICD-10-CM

## 2019-03-13 DIAGNOSIS — M79672 Pain in left foot: Secondary | ICD-10-CM

## 2019-03-13 DIAGNOSIS — I739 Peripheral vascular disease, unspecified: Secondary | ICD-10-CM

## 2019-03-13 DIAGNOSIS — M79671 Pain in right foot: Secondary | ICD-10-CM

## 2019-03-13 DIAGNOSIS — M2041 Other hammer toe(s) (acquired), right foot: Secondary | ICD-10-CM

## 2019-03-13 DIAGNOSIS — M2042 Other hammer toe(s) (acquired), left foot: Secondary | ICD-10-CM | POA: Diagnosis not present

## 2019-03-13 DIAGNOSIS — R2681 Unsteadiness on feet: Secondary | ICD-10-CM

## 2019-03-13 NOTE — Progress Notes (Signed)
Subjective: Keith Mccall is a 86 y.o. male patient with a previous history of diabetes who presents to office today for evaluation, reports that he has a lot of veins that he wants to have check, past compression socks in the past were too tight but did found another pair that is more comfortable without cutting into his legs, reports that he has had a hard time fitting shoes and has some rubbing at the toes not sure if they are are fitting properly. No other issues noted.  Patient Active Problem List   Diagnosis Date Noted  . Anxiety 12/07/2017  . Chest pain in adult 12/07/2017  . Essential hypertension 12/07/2017  . Mixed hyperlipidemia 12/07/2017  . Nonrheumatic aortic valve insufficiency 12/07/2017  . Nonrheumatic mitral valve regurgitation 12/07/2017  . Sick sinus syndrome ([place]) 12/07/2017  . Paroxysmal A-fib ([place]) 12/05/2017  . PVC (premature ventricular contraction) 12/05/2017   Current Outpatient Medications on File Prior to Visit  Medication Sig Dispense Refill  . ACCU-CHEK AVIVA PLUS test strip USE TO CHECK BLOOD SUGAR TWICE A DAY  7  . ALPRAZolam (XANAX) 1 MG tablet Take 1 mg by mouth 2 (two) times daily as needed.      Marland Kitchen apixaban (ELIQUIS) 5 MG TABS tablet Take 1 tablet (5 mg total) by mouth 2 (two) times daily. 60 tablet 6  . Cholecalciferol (VITAMIN D-3) 5000 UNITS TABS Take 1 tablet by mouth daily.      Marland Kitchen Hawthorne Berry 500 MG CAPS Take by mouth at bedtime.      . Lactase (DAIRY DIGESTIVE PO) Take by mouth daily.      . Magnesium 200 MG TABS Take 3 tablets by mouth daily.      . metoprolol tartrate (LOPRESSOR) 25 MG tablet Take 12.5 mg by mouth 2 (two) times daily.      . Omega-3 Fatty Acids (SUPER OMEGA 3 PO) Take by mouth.      . Probiotic Product (PROBIOTIC PO) Take by mouth daily.      . Taurine 1000 MG CAPS Take 2 capsules by mouth daily.      Marland Kitchen Ubiquinol 50 MG CAPS Take by mouth daily.       No current facility-administered medications on file prior to visit.    Allergies  Allergen Reactions  . Amiodarone     Other reaction(s): Other (See Comments) Eye irritation  . Erythromycin Base     Other reaction(s): Other (See Comments) Rapid heart rate  . Polycarbophil     Other reaction(s): Other (See Comments) Chest tightness  . Penicillins Rash  . Sulfa Antibiotics Rash    No results found for this or any previous visit (from the past 2160 hour(s)).  Objective: General: Patient is awake, alert, and oriented x 3 and in no acute distress.  Integument: Skin is warm, dry and supple bilateral. Nails are short mildly dystrophic, 1-5 bilateral. No signs of infection. No open lesions or preulcerative lesions present bilateral. Remaining integument unremarkable.  Vasculature:  Dorsalis Pedis pulse 1/4 bilateral. Posterior Tibial pulse 1/4 bilateral.  Capillary fill time <5 sec 1-5 bilateral. No hair growth to the level of the digits.Temperature gradient within normal limits. + varicosities present bilateral. +Brawny pigmentation. Trace edema present bilateral.   Neurology: The patient has intact sensation measured with a 5.07/10g Semmes Weinstein Monofilament at all pedal sites bilateral. Vibratory sensation diminished bilateral with tuning fork R>L history of spinal stenosis. No Babinski sign present bilateral.   Musculoskeletal: Asymptomatic bunion and hammertoe pedal  deformities noted bilateral. Muscular strength 5/5 in all lower extremity muscular groups bilateral without pain on range of motion. No tenderness with calf compression bilateral.  Assessment and Plan: Problem List Items Addressed This Visit      Other   Anxiety    Other Visit Diagnoses    PVD (peripheral vascular disease) ([place])    -  Primary   Hammer toes of both feet       Gait instability       Foot pain, bilateral         -Examined patient.  -Recommend continue with comfortable compression stockings for edema control -Recommend good supportive shoes that are wider that do  no rub toes, may bring in previous diabetic shoes and insoles for re-assessment since they caused pain for him at the toes and were uncomfortable to see if we can make adjustments or recommend something else for him -Recommend continue with stretching and exercises as taught in the past by PT for strength and gait instability -Answered all patient questions -Patient to return to office for shoe assessment with Benjie Karvonen -Patient advised to call the office if any problems or questions arise in the meantime.  Landis Martins, DPM

## 2019-03-18 ENCOUNTER — Other Ambulatory Visit: Payer: Medicare Other | Admitting: Orthotics

## 2019-03-27 DIAGNOSIS — Z6832 Body mass index (BMI) 32.0-32.9, adult: Secondary | ICD-10-CM | POA: Diagnosis not present

## 2019-03-27 DIAGNOSIS — S99921A Unspecified injury of right foot, initial encounter: Secondary | ICD-10-CM | POA: Diagnosis not present

## 2019-03-27 DIAGNOSIS — M79671 Pain in right foot: Secondary | ICD-10-CM | POA: Diagnosis not present

## 2019-03-31 ENCOUNTER — Other Ambulatory Visit: Payer: Medicare Other | Admitting: Orthotics

## 2019-03-31 DIAGNOSIS — S93101A Unspecified subluxation of right toe(s), initial encounter: Secondary | ICD-10-CM | POA: Diagnosis not present

## 2019-03-31 DIAGNOSIS — M79674 Pain in right toe(s): Secondary | ICD-10-CM | POA: Diagnosis not present

## 2019-04-08 DIAGNOSIS — E1142 Type 2 diabetes mellitus with diabetic polyneuropathy: Secondary | ICD-10-CM | POA: Diagnosis not present

## 2019-04-08 DIAGNOSIS — S90111A Contusion of right great toe without damage to nail, initial encounter: Secondary | ICD-10-CM | POA: Diagnosis not present

## 2019-04-08 DIAGNOSIS — E119 Type 2 diabetes mellitus without complications: Secondary | ICD-10-CM | POA: Diagnosis not present

## 2019-04-08 DIAGNOSIS — M2031 Hallux varus (acquired), right foot: Secondary | ICD-10-CM | POA: Diagnosis not present

## 2019-04-10 DIAGNOSIS — S90111A Contusion of right great toe without damage to nail, initial encounter: Secondary | ICD-10-CM

## 2019-04-10 DIAGNOSIS — E119 Type 2 diabetes mellitus without complications: Secondary | ICD-10-CM

## 2019-04-10 DIAGNOSIS — M2031 Hallux varus (acquired), right foot: Secondary | ICD-10-CM

## 2019-04-10 DIAGNOSIS — E1142 Type 2 diabetes mellitus with diabetic polyneuropathy: Secondary | ICD-10-CM

## 2019-04-10 HISTORY — DX: Hallux varus (acquired), right foot: M20.31

## 2019-04-10 HISTORY — DX: Type 2 diabetes mellitus without complications: E11.9

## 2019-04-10 HISTORY — DX: Type 2 diabetes mellitus with diabetic polyneuropathy: E11.42

## 2019-04-10 HISTORY — DX: Contusion of right great toe without damage to nail, initial encounter: S90.111A

## 2019-04-11 DIAGNOSIS — M546 Pain in thoracic spine: Secondary | ICD-10-CM | POA: Diagnosis not present

## 2019-04-11 DIAGNOSIS — M545 Low back pain: Secondary | ICD-10-CM | POA: Diagnosis not present

## 2019-04-17 DIAGNOSIS — E1151 Type 2 diabetes mellitus with diabetic peripheral angiopathy without gangrene: Secondary | ICD-10-CM | POA: Diagnosis not present

## 2019-04-17 DIAGNOSIS — Z87442 Personal history of urinary calculi: Secondary | ICD-10-CM | POA: Diagnosis not present

## 2019-04-17 DIAGNOSIS — M47816 Spondylosis without myelopathy or radiculopathy, lumbar region: Secondary | ICD-10-CM | POA: Diagnosis not present

## 2019-04-17 DIAGNOSIS — I4821 Permanent atrial fibrillation: Secondary | ICD-10-CM | POA: Diagnosis not present

## 2019-04-17 DIAGNOSIS — F411 Generalized anxiety disorder: Secondary | ICD-10-CM | POA: Diagnosis not present

## 2019-04-17 DIAGNOSIS — I350 Nonrheumatic aortic (valve) stenosis: Secondary | ICD-10-CM | POA: Diagnosis not present

## 2019-04-17 DIAGNOSIS — Z85828 Personal history of other malignant neoplasm of skin: Secondary | ICD-10-CM | POA: Diagnosis not present

## 2019-04-17 DIAGNOSIS — K582 Mixed irritable bowel syndrome: Secondary | ICD-10-CM | POA: Diagnosis not present

## 2019-04-17 DIAGNOSIS — M5136 Other intervertebral disc degeneration, lumbar region: Secondary | ICD-10-CM | POA: Diagnosis not present

## 2019-04-17 DIAGNOSIS — I340 Nonrheumatic mitral (valve) insufficiency: Secondary | ICD-10-CM | POA: Diagnosis not present

## 2019-04-17 DIAGNOSIS — I701 Atherosclerosis of renal artery: Secondary | ICD-10-CM | POA: Diagnosis not present

## 2019-04-17 DIAGNOSIS — I10 Essential (primary) hypertension: Secondary | ICD-10-CM | POA: Diagnosis not present

## 2019-04-22 DIAGNOSIS — M5136 Other intervertebral disc degeneration, lumbar region: Secondary | ICD-10-CM | POA: Diagnosis not present

## 2019-04-22 DIAGNOSIS — F411 Generalized anxiety disorder: Secondary | ICD-10-CM | POA: Diagnosis not present

## 2019-04-22 DIAGNOSIS — I10 Essential (primary) hypertension: Secondary | ICD-10-CM | POA: Diagnosis not present

## 2019-04-22 DIAGNOSIS — E1151 Type 2 diabetes mellitus with diabetic peripheral angiopathy without gangrene: Secondary | ICD-10-CM | POA: Diagnosis not present

## 2019-04-22 DIAGNOSIS — M47816 Spondylosis without myelopathy or radiculopathy, lumbar region: Secondary | ICD-10-CM | POA: Diagnosis not present

## 2019-04-22 DIAGNOSIS — I701 Atherosclerosis of renal artery: Secondary | ICD-10-CM | POA: Diagnosis not present

## 2019-04-29 DIAGNOSIS — E1151 Type 2 diabetes mellitus with diabetic peripheral angiopathy without gangrene: Secondary | ICD-10-CM | POA: Diagnosis not present

## 2019-04-29 DIAGNOSIS — M5136 Other intervertebral disc degeneration, lumbar region: Secondary | ICD-10-CM | POA: Diagnosis not present

## 2019-04-29 DIAGNOSIS — M47816 Spondylosis without myelopathy or radiculopathy, lumbar region: Secondary | ICD-10-CM | POA: Diagnosis not present

## 2019-04-29 DIAGNOSIS — I10 Essential (primary) hypertension: Secondary | ICD-10-CM | POA: Diagnosis not present

## 2019-04-29 DIAGNOSIS — I701 Atherosclerosis of renal artery: Secondary | ICD-10-CM | POA: Diagnosis not present

## 2019-04-29 DIAGNOSIS — F411 Generalized anxiety disorder: Secondary | ICD-10-CM | POA: Diagnosis not present

## 2019-04-30 ENCOUNTER — Other Ambulatory Visit: Payer: Medicare Other | Admitting: Orthotics

## 2019-05-01 DIAGNOSIS — M5136 Other intervertebral disc degeneration, lumbar region: Secondary | ICD-10-CM | POA: Diagnosis not present

## 2019-05-01 DIAGNOSIS — F411 Generalized anxiety disorder: Secondary | ICD-10-CM | POA: Diagnosis not present

## 2019-05-01 DIAGNOSIS — E1151 Type 2 diabetes mellitus with diabetic peripheral angiopathy without gangrene: Secondary | ICD-10-CM | POA: Diagnosis not present

## 2019-05-01 DIAGNOSIS — I701 Atherosclerosis of renal artery: Secondary | ICD-10-CM | POA: Diagnosis not present

## 2019-05-01 DIAGNOSIS — M47816 Spondylosis without myelopathy or radiculopathy, lumbar region: Secondary | ICD-10-CM | POA: Diagnosis not present

## 2019-05-01 DIAGNOSIS — I10 Essential (primary) hypertension: Secondary | ICD-10-CM | POA: Diagnosis not present

## 2019-05-05 DIAGNOSIS — E1151 Type 2 diabetes mellitus with diabetic peripheral angiopathy without gangrene: Secondary | ICD-10-CM | POA: Diagnosis not present

## 2019-05-05 DIAGNOSIS — I701 Atherosclerosis of renal artery: Secondary | ICD-10-CM | POA: Diagnosis not present

## 2019-05-05 DIAGNOSIS — F411 Generalized anxiety disorder: Secondary | ICD-10-CM | POA: Diagnosis not present

## 2019-05-05 DIAGNOSIS — I10 Essential (primary) hypertension: Secondary | ICD-10-CM | POA: Diagnosis not present

## 2019-05-05 DIAGNOSIS — M47816 Spondylosis without myelopathy or radiculopathy, lumbar region: Secondary | ICD-10-CM | POA: Diagnosis not present

## 2019-05-05 DIAGNOSIS — M5136 Other intervertebral disc degeneration, lumbar region: Secondary | ICD-10-CM | POA: Diagnosis not present

## 2019-05-09 ENCOUNTER — Other Ambulatory Visit: Payer: Self-pay | Admitting: Internal Medicine

## 2019-05-09 DIAGNOSIS — M5136 Other intervertebral disc degeneration, lumbar region: Secondary | ICD-10-CM

## 2019-05-09 DIAGNOSIS — M47816 Spondylosis without myelopathy or radiculopathy, lumbar region: Secondary | ICD-10-CM

## 2019-05-09 DIAGNOSIS — M549 Dorsalgia, unspecified: Secondary | ICD-10-CM

## 2019-05-13 DIAGNOSIS — M47816 Spondylosis without myelopathy or radiculopathy, lumbar region: Secondary | ICD-10-CM | POA: Diagnosis not present

## 2019-05-13 DIAGNOSIS — M5136 Other intervertebral disc degeneration, lumbar region: Secondary | ICD-10-CM | POA: Diagnosis not present

## 2019-05-13 DIAGNOSIS — I10 Essential (primary) hypertension: Secondary | ICD-10-CM | POA: Diagnosis not present

## 2019-05-13 DIAGNOSIS — F411 Generalized anxiety disorder: Secondary | ICD-10-CM | POA: Diagnosis not present

## 2019-05-13 DIAGNOSIS — I701 Atherosclerosis of renal artery: Secondary | ICD-10-CM | POA: Diagnosis not present

## 2019-05-13 DIAGNOSIS — E1151 Type 2 diabetes mellitus with diabetic peripheral angiopathy without gangrene: Secondary | ICD-10-CM | POA: Diagnosis not present

## 2019-05-15 DIAGNOSIS — M47816 Spondylosis without myelopathy or radiculopathy, lumbar region: Secondary | ICD-10-CM | POA: Diagnosis not present

## 2019-05-15 DIAGNOSIS — E1151 Type 2 diabetes mellitus with diabetic peripheral angiopathy without gangrene: Secondary | ICD-10-CM | POA: Diagnosis not present

## 2019-05-15 DIAGNOSIS — I701 Atherosclerosis of renal artery: Secondary | ICD-10-CM | POA: Diagnosis not present

## 2019-05-15 DIAGNOSIS — M5136 Other intervertebral disc degeneration, lumbar region: Secondary | ICD-10-CM | POA: Diagnosis not present

## 2019-05-15 DIAGNOSIS — I10 Essential (primary) hypertension: Secondary | ICD-10-CM | POA: Diagnosis not present

## 2019-05-15 DIAGNOSIS — F411 Generalized anxiety disorder: Secondary | ICD-10-CM | POA: Diagnosis not present

## 2019-05-16 ENCOUNTER — Other Ambulatory Visit: Payer: Self-pay

## 2019-05-16 ENCOUNTER — Ambulatory Visit
Admission: RE | Admit: 2019-05-16 | Discharge: 2019-05-16 | Disposition: A | Discharge status: Home / Self Care | Payer: Medicare Other | Source: Ambulatory Visit | Attending: Internal Medicine | Admitting: Internal Medicine

## 2019-05-16 DIAGNOSIS — M549 Dorsalgia, unspecified: Secondary | ICD-10-CM

## 2019-05-16 DIAGNOSIS — M48061 Spinal stenosis, lumbar region without neurogenic claudication: Secondary | ICD-10-CM | POA: Diagnosis not present

## 2019-05-16 DIAGNOSIS — M5136 Other intervertebral disc degeneration, lumbar region: Secondary | ICD-10-CM

## 2019-05-16 DIAGNOSIS — M47816 Spondylosis without myelopathy or radiculopathy, lumbar region: Secondary | ICD-10-CM

## 2019-05-17 DIAGNOSIS — Z85828 Personal history of other malignant neoplasm of skin: Secondary | ICD-10-CM | POA: Diagnosis not present

## 2019-05-17 DIAGNOSIS — E1151 Type 2 diabetes mellitus with diabetic peripheral angiopathy without gangrene: Secondary | ICD-10-CM | POA: Diagnosis not present

## 2019-05-17 DIAGNOSIS — I4821 Permanent atrial fibrillation: Secondary | ICD-10-CM | POA: Diagnosis not present

## 2019-05-17 DIAGNOSIS — F411 Generalized anxiety disorder: Secondary | ICD-10-CM | POA: Diagnosis not present

## 2019-05-17 DIAGNOSIS — I701 Atherosclerosis of renal artery: Secondary | ICD-10-CM | POA: Diagnosis not present

## 2019-05-17 DIAGNOSIS — M5136 Other intervertebral disc degeneration, lumbar region: Secondary | ICD-10-CM | POA: Diagnosis not present

## 2019-05-17 DIAGNOSIS — I10 Essential (primary) hypertension: Secondary | ICD-10-CM | POA: Diagnosis not present

## 2019-05-17 DIAGNOSIS — Z87442 Personal history of urinary calculi: Secondary | ICD-10-CM | POA: Diagnosis not present

## 2019-05-17 DIAGNOSIS — I340 Nonrheumatic mitral (valve) insufficiency: Secondary | ICD-10-CM | POA: Diagnosis not present

## 2019-05-17 DIAGNOSIS — I350 Nonrheumatic aortic (valve) stenosis: Secondary | ICD-10-CM | POA: Diagnosis not present

## 2019-05-17 DIAGNOSIS — K582 Mixed irritable bowel syndrome: Secondary | ICD-10-CM | POA: Diagnosis not present

## 2019-05-17 DIAGNOSIS — M47816 Spondylosis without myelopathy or radiculopathy, lumbar region: Secondary | ICD-10-CM | POA: Diagnosis not present

## 2019-05-21 DIAGNOSIS — I701 Atherosclerosis of renal artery: Secondary | ICD-10-CM | POA: Diagnosis not present

## 2019-05-21 DIAGNOSIS — M47816 Spondylosis without myelopathy or radiculopathy, lumbar region: Secondary | ICD-10-CM | POA: Diagnosis not present

## 2019-05-21 DIAGNOSIS — F411 Generalized anxiety disorder: Secondary | ICD-10-CM | POA: Diagnosis not present

## 2019-05-21 DIAGNOSIS — E1151 Type 2 diabetes mellitus with diabetic peripheral angiopathy without gangrene: Secondary | ICD-10-CM | POA: Diagnosis not present

## 2019-05-21 DIAGNOSIS — M5136 Other intervertebral disc degeneration, lumbar region: Secondary | ICD-10-CM | POA: Diagnosis not present

## 2019-05-21 DIAGNOSIS — I10 Essential (primary) hypertension: Secondary | ICD-10-CM | POA: Diagnosis not present

## 2019-05-27 DIAGNOSIS — I701 Atherosclerosis of renal artery: Secondary | ICD-10-CM | POA: Diagnosis not present

## 2019-05-27 DIAGNOSIS — F411 Generalized anxiety disorder: Secondary | ICD-10-CM | POA: Diagnosis not present

## 2019-05-27 DIAGNOSIS — E1151 Type 2 diabetes mellitus with diabetic peripheral angiopathy without gangrene: Secondary | ICD-10-CM | POA: Diagnosis not present

## 2019-05-27 DIAGNOSIS — M47816 Spondylosis without myelopathy or radiculopathy, lumbar region: Secondary | ICD-10-CM | POA: Diagnosis not present

## 2019-05-27 DIAGNOSIS — I10 Essential (primary) hypertension: Secondary | ICD-10-CM | POA: Diagnosis not present

## 2019-05-27 DIAGNOSIS — M5136 Other intervertebral disc degeneration, lumbar region: Secondary | ICD-10-CM | POA: Diagnosis not present

## 2019-05-29 DIAGNOSIS — I701 Atherosclerosis of renal artery: Secondary | ICD-10-CM | POA: Diagnosis not present

## 2019-05-29 DIAGNOSIS — M47816 Spondylosis without myelopathy or radiculopathy, lumbar region: Secondary | ICD-10-CM | POA: Diagnosis not present

## 2019-05-29 DIAGNOSIS — I10 Essential (primary) hypertension: Secondary | ICD-10-CM | POA: Diagnosis not present

## 2019-05-29 DIAGNOSIS — F411 Generalized anxiety disorder: Secondary | ICD-10-CM | POA: Diagnosis not present

## 2019-05-29 DIAGNOSIS — M5136 Other intervertebral disc degeneration, lumbar region: Secondary | ICD-10-CM | POA: Diagnosis not present

## 2019-05-29 DIAGNOSIS — E1151 Type 2 diabetes mellitus with diabetic peripheral angiopathy without gangrene: Secondary | ICD-10-CM | POA: Diagnosis not present

## 2019-06-02 DIAGNOSIS — I701 Atherosclerosis of renal artery: Secondary | ICD-10-CM | POA: Diagnosis not present

## 2019-06-02 DIAGNOSIS — I10 Essential (primary) hypertension: Secondary | ICD-10-CM | POA: Diagnosis not present

## 2019-06-02 DIAGNOSIS — M47816 Spondylosis without myelopathy or radiculopathy, lumbar region: Secondary | ICD-10-CM | POA: Diagnosis not present

## 2019-06-02 DIAGNOSIS — M5136 Other intervertebral disc degeneration, lumbar region: Secondary | ICD-10-CM | POA: Diagnosis not present

## 2019-06-02 DIAGNOSIS — F411 Generalized anxiety disorder: Secondary | ICD-10-CM | POA: Diagnosis not present

## 2019-06-02 DIAGNOSIS — E1151 Type 2 diabetes mellitus with diabetic peripheral angiopathy without gangrene: Secondary | ICD-10-CM | POA: Diagnosis not present

## 2019-06-07 ENCOUNTER — Other Ambulatory Visit: Payer: Medicare Other

## 2019-06-10 DIAGNOSIS — L820 Inflamed seborrheic keratosis: Secondary | ICD-10-CM | POA: Diagnosis not present

## 2019-06-11 DIAGNOSIS — E1151 Type 2 diabetes mellitus with diabetic peripheral angiopathy without gangrene: Secondary | ICD-10-CM | POA: Diagnosis not present

## 2019-06-11 DIAGNOSIS — M47816 Spondylosis without myelopathy or radiculopathy, lumbar region: Secondary | ICD-10-CM | POA: Diagnosis not present

## 2019-06-11 DIAGNOSIS — M5136 Other intervertebral disc degeneration, lumbar region: Secondary | ICD-10-CM | POA: Diagnosis not present

## 2019-06-11 DIAGNOSIS — I10 Essential (primary) hypertension: Secondary | ICD-10-CM | POA: Diagnosis not present

## 2019-06-11 DIAGNOSIS — F411 Generalized anxiety disorder: Secondary | ICD-10-CM | POA: Diagnosis not present

## 2019-06-11 DIAGNOSIS — I701 Atherosclerosis of renal artery: Secondary | ICD-10-CM | POA: Diagnosis not present

## 2019-06-12 DIAGNOSIS — I701 Atherosclerosis of renal artery: Secondary | ICD-10-CM | POA: Diagnosis not present

## 2019-06-12 DIAGNOSIS — K582 Mixed irritable bowel syndrome: Secondary | ICD-10-CM | POA: Diagnosis not present

## 2019-06-12 DIAGNOSIS — M5136 Other intervertebral disc degeneration, lumbar region: Secondary | ICD-10-CM | POA: Diagnosis not present

## 2019-06-12 DIAGNOSIS — I10 Essential (primary) hypertension: Secondary | ICD-10-CM | POA: Diagnosis not present

## 2019-06-12 DIAGNOSIS — E1151 Type 2 diabetes mellitus with diabetic peripheral angiopathy without gangrene: Secondary | ICD-10-CM | POA: Diagnosis not present

## 2019-06-12 DIAGNOSIS — M47816 Spondylosis without myelopathy or radiculopathy, lumbar region: Secondary | ICD-10-CM | POA: Diagnosis not present

## 2019-06-12 DIAGNOSIS — F411 Generalized anxiety disorder: Secondary | ICD-10-CM | POA: Diagnosis not present

## 2019-06-13 DIAGNOSIS — F411 Generalized anxiety disorder: Secondary | ICD-10-CM | POA: Diagnosis not present

## 2019-06-13 DIAGNOSIS — I4821 Permanent atrial fibrillation: Secondary | ICD-10-CM | POA: Diagnosis not present

## 2019-06-13 DIAGNOSIS — I10 Essential (primary) hypertension: Secondary | ICD-10-CM | POA: Diagnosis not present

## 2019-06-13 DIAGNOSIS — E1169 Type 2 diabetes mellitus with other specified complication: Secondary | ICD-10-CM | POA: Diagnosis not present

## 2019-06-13 DIAGNOSIS — M47816 Spondylosis without myelopathy or radiculopathy, lumbar region: Secondary | ICD-10-CM | POA: Diagnosis not present

## 2019-06-13 DIAGNOSIS — Z6832 Body mass index (BMI) 32.0-32.9, adult: Secondary | ICD-10-CM | POA: Diagnosis not present

## 2019-07-03 DIAGNOSIS — D696 Thrombocytopenia, unspecified: Secondary | ICD-10-CM | POA: Diagnosis not present

## 2019-08-07 DIAGNOSIS — M4807 Spinal stenosis, lumbosacral region: Secondary | ICD-10-CM | POA: Diagnosis not present

## 2019-08-07 DIAGNOSIS — S32020A Wedge compression fracture of second lumbar vertebra, initial encounter for closed fracture: Secondary | ICD-10-CM | POA: Diagnosis not present

## 2019-08-19 DIAGNOSIS — N200 Calculus of kidney: Secondary | ICD-10-CM | POA: Diagnosis not present

## 2019-08-19 DIAGNOSIS — N281 Cyst of kidney, acquired: Secondary | ICD-10-CM | POA: Diagnosis not present

## 2019-08-19 DIAGNOSIS — R350 Frequency of micturition: Secondary | ICD-10-CM | POA: Diagnosis not present

## 2019-08-19 DIAGNOSIS — R3121 Asymptomatic microscopic hematuria: Secondary | ICD-10-CM | POA: Diagnosis not present

## 2019-08-28 DIAGNOSIS — R0602 Shortness of breath: Secondary | ICD-10-CM | POA: Diagnosis not present

## 2019-08-28 DIAGNOSIS — I4891 Unspecified atrial fibrillation: Secondary | ICD-10-CM | POA: Diagnosis not present

## 2019-08-28 DIAGNOSIS — M4856XD Collapsed vertebra, not elsewhere classified, lumbar region, subsequent encounter for fracture with routine healing: Secondary | ICD-10-CM | POA: Diagnosis not present

## 2019-08-28 DIAGNOSIS — I10 Essential (primary) hypertension: Secondary | ICD-10-CM | POA: Diagnosis not present

## 2019-08-28 DIAGNOSIS — R0789 Other chest pain: Secondary | ICD-10-CM | POA: Diagnosis not present

## 2019-08-28 DIAGNOSIS — R079 Chest pain, unspecified: Secondary | ICD-10-CM | POA: Diagnosis not present

## 2019-09-08 NOTE — Progress Notes (Signed)
PCP:  Ernestene Kiel, MD Primary Cardiologist: Will Meredith Leeds, MD  Keith Mccall is a 86 y.o. male seen today for Dr. Curt Bears for acute visit due to chest pain.  Since last being seen in our clinic the patient reports doing OK. He occasionally has breathlessness that has been chronic and associated with anxiety. It is intermittently associated with exertion, and his PCP is concerned about angina. Pt is self admittedly very anxious, and knows that this effects his symptoms. He has no clear exertional symptoms. He had a normal stress echo in 2008 and normal EF by echo 2019.    Past Medical History:  Diagnosis Date  . Anxiety   . Atrial fibrillation ([place])   . Chest pain in adult 12/07/2017  . Essential hypertension 12/07/2017  . Mixed hyperlipidemia 12/07/2017  . Nonrheumatic aortic valve insufficiency 12/07/2017  . Nonrheumatic mitral valve regurgitation 12/07/2017  . Paroxysmal A-fib ([place]) 12/05/2017  . PVC (premature ventricular contraction) 12/05/2017  . Sick sinus syndrome ([place]) 12/07/2017   Past Surgical History:  Procedure Laterality Date  . HERNIA REPAIR      Current Outpatient Medications  Medication Sig Dispense Refill  . ACCU-CHEK AVIVA PLUS test strip USE TO CHECK BLOOD SUGAR TWICE A DAY  7  . ALPRAZolam (XANAX) 1 MG tablet Take 1 mg by mouth 2 (two) times daily as needed.      . clobetasol (OLUX) 0.05 % topical foam Apply topically 2 (two) times daily. Apply to scalp twice daily    . Flaxseed, Linseed, (FLAXSEED OIL PO) Take by mouth once a week. 1 tablespoon    . Hawthorne Berry 500 MG CAPS Take by mouth at bedtime.      . Lactase (DAIRY DIGESTIVE PO) Take by mouth daily.      . metoprolol tartrate (LOPRESSOR) 25 MG tablet Take 12.5 mg by mouth 2 (two) times daily.      . nitroGLYCERIN (NITROSTAT) 0.4 MG SL tablet Place under the tongue as needed for chest pain.    . Omega-3 Fatty Acids (SUPER OMEGA 3 PO) Take by mouth once a week. Cod liver oil    .  Probiotic Product (PROBIOTIC PO) Take by mouth daily.      . Taurine 1000 MG CAPS Take 2 capsules by mouth daily.      Marland Kitchen Ubiquinol 50 MG CAPS Take by mouth daily.       No current facility-administered medications for this visit.    Allergies  Allergen Reactions  . Amiodarone     Other reaction(s): Other (See Comments) Eye irritation  . Erythromycin Base     Other reaction(s): Other (See Comments) Rapid heart rate  . Polycarbophil     Other reaction(s): Other (See Comments) Chest tightness  . Penicillins Rash  . Sulfa Antibiotics Rash    Social History   Socioeconomic History  . Marital status: Married    Spouse name: Not on file  . Number of children: 3  . Years of education: Not on file  . Highest education level: Not on file  Occupational History  . Occupation: retired  Tobacco Use  . Smoking status: Never Smoker  . Smokeless tobacco: Never Used  Substance and Sexual Activity  . Alcohol use: No  . Drug use: No  . Sexual activity: Not on file  Other Topics Concern  . Not on file  Social History Narrative  . Not on file   Social Determinants of Health   Financial Resource Strain:   .  Difficulty of Paying Living Expenses: Not on file  Food Insecurity:   . Worried About Charity fundraiser in the Last Year: Not on file  . Ran Out of Food in the Last Year: Not on file  Transportation Needs:   . Lack of Transportation (Medical): Not on file  . Lack of Transportation (Non-Medical): Not on file  Physical Activity:   . Days of Exercise per Week: Not on file  . Minutes of Exercise per Session: Not on file  Stress:   . Feeling of Stress : Not on file  Social Connections:   . Frequency of Communication with Friends and Family: Not on file  . Frequency of Social Gatherings with Friends and Family: Not on file  . Attends Religious Services: Not on file  . Active Member of Clubs or Organizations: Not on file  . Attends Archivist Meetings: Not on file  .  Marital Status: Not on file  Intimate Partner Violence:   . Fear of Current or Ex-Partner: Not on file  . Emotionally Abused: Not on file  . Physically Abused: Not on file  . Sexually Abused: Not on file     Review of Systems: General: No chills, fever, night sweats or weight changes  Cardiovascular:  No chest pain, dyspnea on exertion, edema, orthopnea, palpitations, paroxysmal nocturnal dyspnea Dermatological: No rash, lesions or masses Respiratory: No cough, dyspnea Urologic: No hematuria, dysuria Abdominal: No nausea, vomiting, diarrhea, bright red blood per rectum, melena, or hematemesis Neurologic: No visual changes, weakness, changes in mental status All other systems reviewed and are otherwise negative except as noted above.  Physical Exam: Vitals:   09/09/19 1137  BP: 128/78  Pulse: 80  Weight: 236 lb (107 kg)  Height: 6' (1.829 m)    GEN- The patient is well appearing, alert and oriented x 3 today.   HEENT: normocephalic, atraumatic; sclera clear, conjunctiva pink; hearing intact; oropharynx clear; neck supple, no JVP Lymph- no cervical lymphadenopathy Lungs- Clear to ausculation bilaterally, normal work of breathing.  No wheezes, rales, rhonchi Heart- Irregular rate and rhythm, no murmurs, rubs or gallops, PMI not laterally displaced GI- soft, non-tender, non-distended, bowel sounds present, no hepatosplenomegaly Extremities- no clubbing, cyanosis, or edema; DP/PT/radial pulses 2+ bilaterally MS- no significant deformity or atrophy Skin- warm and dry, no rash or lesion Psych- euthymic mood, full affect Neuro- strength and sensation are intact  EKG is not ordered. See scanned reports for recent EKGs. These show Atrial fibrillation with controlled ventricular rates.  Additional studies reviewed include: EKGs pt brought with him, previous EP office notes.   Assessment and Plan:  1. Permanent atrial fibrillation Continue metoprolol He has not been taking  eliquis for CHA2DS2VASC of at least 4. I recommended he restart this. He would like to think about it.   2. HTN Stable on current regiment  3. Chest pain, atypical mostly, with questionable exertional component at times, but not primarily. For completeness and per PCP request, we will order a lexiscan myoview. I think patients anxiety otherwise would significantly reduce his quality of life.   Shirley Friar, PA-C  09/09/19 11:44 AM

## 2019-09-09 ENCOUNTER — Other Ambulatory Visit: Payer: Self-pay

## 2019-09-09 ENCOUNTER — Encounter: Payer: Self-pay | Admitting: Student

## 2019-09-09 ENCOUNTER — Ambulatory Visit (INDEPENDENT_AMBULATORY_CARE_PROVIDER_SITE_OTHER): Payer: Medicare Other | Admitting: Student

## 2019-09-09 VITALS — BP 128/78 | HR 80 | Ht 72.0 in | Wt 236.0 lb

## 2019-09-09 DIAGNOSIS — I10 Essential (primary) hypertension: Secondary | ICD-10-CM

## 2019-09-09 DIAGNOSIS — I4821 Permanent atrial fibrillation: Secondary | ICD-10-CM | POA: Diagnosis not present

## 2019-09-09 DIAGNOSIS — R079 Chest pain, unspecified: Secondary | ICD-10-CM

## 2019-09-09 NOTE — Patient Instructions (Addendum)
Medication Instructions:  *If you need a refill on your cardiac medications before your next appointment, please call your pharmacy*  Lab Work: If you have labs (blood work) drawn today and your tests are completely normal, you will receive your results only by: Marland Kitchen MyChart Message (if you have MyChart) OR . A paper copy in the mail If you have any lab test that is abnormal or we need to change your treatment, we will call you to review the results.  Testing/Procedures: Your physician has requested that you have a lexiscan myoview. For further information please visit [email or web address]. Please follow instruction sheet, as given.   Follow-Up: At [facility], you and your health needs are our priority.  As part of our continuing mission to provide you with exceptional heart care, we have created designated Provider Care Teams.  These Care Teams include your primary Cardiologist (physician) and Advanced Practice Providers (APPs -  Physician Assistants and Nurse Practitioners) who all work together to provide you with the care you need, when you need it.  We recommend signing up for the patient portal called "MyChart".  Sign up information is provided on this After Visit Summary.  MyChart is used to connect with patients for Virtual Visits (Telemedicine).  Patients are able to view lab/test results, encounter notes, upcoming appointments, etc.  Non-urgent messages can be sent to your provider as well.   To learn more about what you can do with MyChart, go to [email or web address].    Your next appointment:   Your physician wants you to follow-up in: 77 MONTHS with Dr. Curt Bears.  The format for your next appointment:   In Person with Allegra Lai, MD

## 2019-09-11 ENCOUNTER — Telehealth (HOSPITAL_COMMUNITY): Payer: Self-pay | Admitting: *Deleted

## 2019-09-11 NOTE — Telephone Encounter (Signed)
Patient given detailed instructions per Myocardial Perfusion Study Information Sheet for the test on 09/15/19 at 10:30. Patient notified to arrive 15 minutes early and that it is imperative to arrive on time for appointment to keep from having the test rescheduled.  If you need to cancel or reschedule your appointment, please call the office within 24 hours of your appointment. . Patient verbalized understanding.Keith Mccall

## 2019-09-15 ENCOUNTER — Telehealth (HOSPITAL_COMMUNITY): Payer: Self-pay | Admitting: Student

## 2019-09-15 ENCOUNTER — Encounter (HOSPITAL_COMMUNITY): Payer: Medicare Other

## 2019-09-15 NOTE — Telephone Encounter (Signed)
Patient called and cancelled Myoview for this morning 09/15/19.  He fell and injured himself and will call back at  Later date and time to reschedule. Order will be removed from the [place] and when patient calls back to schedule we can reinstate the order.

## 2019-09-16 DIAGNOSIS — Z6831 Body mass index (BMI) 31.0-31.9, adult: Secondary | ICD-10-CM | POA: Diagnosis not present

## 2019-09-16 DIAGNOSIS — S32020A Wedge compression fracture of second lumbar vertebra, initial encounter for closed fracture: Secondary | ICD-10-CM | POA: Diagnosis not present

## 2019-09-16 DIAGNOSIS — I10 Essential (primary) hypertension: Secondary | ICD-10-CM | POA: Diagnosis not present

## 2019-09-23 DIAGNOSIS — E119 Type 2 diabetes mellitus without complications: Secondary | ICD-10-CM | POA: Diagnosis not present

## 2019-09-23 DIAGNOSIS — Z961 Presence of intraocular lens: Secondary | ICD-10-CM | POA: Diagnosis not present

## 2019-09-26 DIAGNOSIS — I4821 Permanent atrial fibrillation: Secondary | ICD-10-CM | POA: Diagnosis not present

## 2019-09-26 DIAGNOSIS — I10 Essential (primary) hypertension: Secondary | ICD-10-CM | POA: Diagnosis not present

## 2019-09-26 DIAGNOSIS — I361 Nonrheumatic tricuspid (valve) insufficiency: Secondary | ICD-10-CM | POA: Diagnosis not present

## 2019-09-26 DIAGNOSIS — I340 Nonrheumatic mitral (valve) insufficiency: Secondary | ICD-10-CM | POA: Diagnosis not present

## 2019-09-26 DIAGNOSIS — I350 Nonrheumatic aortic (valve) stenosis: Secondary | ICD-10-CM | POA: Diagnosis not present

## 2019-09-30 DIAGNOSIS — I4821 Permanent atrial fibrillation: Secondary | ICD-10-CM | POA: Diagnosis not present

## 2019-10-01 ENCOUNTER — Ambulatory Visit: Payer: Medicare Other | Admitting: Student

## 2019-10-03 DIAGNOSIS — I4821 Permanent atrial fibrillation: Secondary | ICD-10-CM | POA: Insufficient documentation

## 2019-10-03 DIAGNOSIS — I4891 Unspecified atrial fibrillation: Secondary | ICD-10-CM | POA: Insufficient documentation

## 2019-10-06 ENCOUNTER — Other Ambulatory Visit: Payer: Self-pay

## 2019-10-06 ENCOUNTER — Ambulatory Visit (INDEPENDENT_AMBULATORY_CARE_PROVIDER_SITE_OTHER): Payer: Medicare Other | Admitting: Cardiology

## 2019-10-06 ENCOUNTER — Encounter: Payer: Self-pay | Admitting: Cardiology

## 2019-10-06 VITALS — BP 134/78 | HR 75 | Ht 72.0 in | Wt 234.0 lb

## 2019-10-06 DIAGNOSIS — R072 Precordial pain: Secondary | ICD-10-CM

## 2019-10-06 DIAGNOSIS — I4891 Unspecified atrial fibrillation: Secondary | ICD-10-CM | POA: Diagnosis not present

## 2019-10-06 DIAGNOSIS — R600 Localized edema: Secondary | ICD-10-CM

## 2019-10-06 DIAGNOSIS — I340 Nonrheumatic mitral (valve) insufficiency: Secondary | ICD-10-CM

## 2019-10-06 DIAGNOSIS — R931 Abnormal findings on diagnostic imaging of heart and coronary circulation: Secondary | ICD-10-CM

## 2019-10-06 DIAGNOSIS — I351 Nonrheumatic aortic (valve) insufficiency: Secondary | ICD-10-CM

## 2019-10-06 MED ORDER — POTASSIUM CHLORIDE CRYS ER 20 MEQ PO TBCR: EXTENDED_RELEASE_TABLET | ORAL | 3 refills | Status: DC

## 2019-10-06 MED ORDER — FUROSEMIDE 20 MG PO TABS: ORAL_TABLET | ORAL | 3 refills | Status: DC

## 2019-10-06 NOTE — Progress Notes (Signed)
Cardiology Office Note:    Date:  10/06/2019   ID:  Keith Mccall, DOB 04/08/1930, MRN [id number]  PCP:  Ernestene Kiel, MD  Cardiologist:  Constance Haw, MD  Electrophysiologist:  None   Referring MD: Ernestene Kiel, MD   Chief Complaint  Patient presents with  . Follow-up    Echo    History of Present Illness:    Keith Mccall is a 86 y.o. male with a hx of permanent atrial fibrillation on metoprolol however patient reports that he had declined anticoagulation and prefers not to take any anticoagulation, diabetes type 2, hypertension, hyperlipidemia presents today to establish general cardiology care.  The patient had seen my EP partners in the past for his atrial fibrillation. He tells me he is here today as he has been experiencing intermittent chest pressure. He described as a midsternal chest discomfort that last few minutes mostly on exertion. He notes that sometimes he feels this and takes Xanax get some relief but recently he has been experiencing increasing episodes. He was given nitroglycerin by his PCP he had not taken his medication yet. He did see EP back in August at which time Carlton Adam was ordered but the patient was not able to get this testing scheduled.  In the meantime he had an echocardiogram done at [facility] and was also asked by his PCP to come and discuss this result with cardiology. No other complaints at this time.  Past Medical History:  Diagnosis Date  . Anxiety   . Atrial fibrillation ([place])   . Chest pain in adult 12/07/2017  . Comprehensive diabetic foot examination, type 2 DM, encounter for ([place]) 04/10/2019  . Compression fracture of C-spine ([place])   . Contusion of right great toe without damage to nail 04/10/2019  . Diabetic polyneuropathy associated with type 2 diabetes mellitus ([place]) 04/10/2019  . Essential hypertension 12/07/2017  . Hallux hammertoe, right 04/10/2019  . Mixed hyperlipidemia 12/07/2017  . Nonrheumatic aortic  valve insufficiency 12/07/2017  . Nonrheumatic mitral valve regurgitation 12/07/2017  . Paroxysmal A-fib ([place]) 12/05/2017  . PVC (premature ventricular contraction) 12/05/2017  . Sick sinus syndrome ([place]) 12/07/2017  . Spinal stenosis     Past Surgical History:  Procedure Laterality Date  . HERNIA REPAIR      Current Medications: Current Meds  Medication Sig  . ACCU-CHEK AVIVA PLUS test strip USE TO CHECK BLOOD SUGAR TWICE A DAY  . ALPRAZolam (XANAX) 1 MG tablet Take 1 mg by mouth 2 (two) times daily as needed.    Marland Kitchen co-enzyme Q-10 30 MG capsule Take 30 mg by mouth 3 (three) times daily.  . Flaxseed, Linseed, (FLAXSEED OIL PO) Take by mouth once a week. 1 tablespoon  . Hawthorne Berry 500 MG CAPS Take by mouth at bedtime.    . metoprolol tartrate (LOPRESSOR) 25 MG tablet Take 12.5 mg by mouth 2 (two) times daily.    . nitroGLYCERIN (NITROSTAT) 0.4 MG SL tablet Place under the tongue as needed for chest pain.  . Omega-3 Fatty Acids (SUPER OMEGA 3 PO) Take by mouth once a week. Cod liver oil  . Probiotic Product (PROBIOTIC PO) Take by mouth daily.    . Taurine 1000 MG CAPS Take 2 capsules by mouth daily.    Marland Kitchen Ubiquinol 50 MG CAPS Take by mouth daily.       Allergies:   Amiodarone, Erythromycin base, Polycarbophil, Penicillins, and Sulfa antibiotics   Social History   Socioeconomic History  . Marital status: Married  Spouse name: Not on file  . Number of children: 3  . Years of education: Not on file  . Highest education level: Not on file  Occupational History  . Occupation: retired  Tobacco Use  . Smoking status: Never Smoker  . Smokeless tobacco: Never Used  Substance and Sexual Activity  . Alcohol use: No  . Drug use: No  . Sexual activity: Not on file  Other Topics Concern  . Not on file  Social History Narrative  . Not on file   Social Determinants of Health   Financial Resource Strain:   . Difficulty of Paying Living Expenses: Not on file  Food  Insecurity:   . Worried About Charity fundraiser in the Last Year: Not on file  . Ran Out of Food in the Last Year: Not on file  Transportation Needs:   . Lack of Transportation (Medical): Not on file  . Lack of Transportation (Non-Medical): Not on file  Physical Activity:   . Days of Exercise per Week: Not on file  . Minutes of Exercise per Session: Not on file  Stress:   . Feeling of Stress : Not on file  Social Connections:   . Frequency of Communication with Friends and Family: Not on file  . Frequency of Social Gatherings with Friends and Family: Not on file  . Attends Religious Services: Not on file  . Active Member of Clubs or Organizations: Not on file  . Attends Archivist Meetings: Not on file  . Marital Status: Not on file     Family History: The patient's family history includes Breast cancer in his sister; CAD in his father; Coronary artery disease in his father; Diabetes in his maternal grandmother, mother, and sister; GI Bleed in his sister; Heart attack in his mother; Heart disease in his mother; Hyperlipidemia in his sister; Hypertension in his father; Lung cancer in his mother; Migraines in his child; Stroke in his father.  ROS:   Review of Systems  Constitution: Negative for decreased appetite, fever and weight gain.  HENT: Negative for congestion, ear discharge, hoarse voice and sore throat.   Eyes: Negative for discharge, redness, vision loss in right eye and visual halos.  Cardiovascular: Negative for chest pain, dyspnea on exertion, leg swelling, orthopnea and palpitations.  Respiratory: Negative for cough, hemoptysis, shortness of breath and snoring.   Endocrine: Negative for heat intolerance and polyphagia.  Hematologic/Lymphatic: Negative for bleeding problem. Does not bruise/bleed easily.  Skin: Negative for flushing, nail changes, rash and suspicious lesions.  Musculoskeletal: Negative for arthritis, joint pain, muscle cramps, myalgias, neck  pain and stiffness.  Gastrointestinal: Negative for abdominal pain, bowel incontinence, diarrhea and excessive appetite.  Genitourinary: Negative for decreased libido, genital sores and incomplete emptying.  Neurological: Negative for brief paralysis, focal weakness, headaches and loss of balance.  Psychiatric/Behavioral: Negative for altered mental status, depression and suicidal ideas.  Allergic/Immunologic: Negative for HIV exposure and persistent infections.    EKGs/Labs/Other Studies Reviewed:    The following studies were reviewed today:   EKG:  The ekg ordered today demonstrates atrial fibrillation with controlled ventricular rate 75 bpm with intermittent PVCs.  His echocardiogram report was a bit confusing given the fact that it was reporting normal EF and secondly some moderate to severely impaired LV function. I therefore called Dr. Bettina Gavia who was at the hospital to review this imaging with me and based on his assessment patient does have an EF of 45 to 50%. Mild aortic  stenosis. Severely dilated left atrium by volume. Moderate mitral regurgitation is present. Mitral has regurgitation present.  Recent Labs: No results found for requested labs within last 8760 hours.  Recent Lipid Panel No results found for: CHOL, TRIG, HDL, CHOLHDL, VLDL, LDLCALC, LDLDIRECT  Physical Exam:    VS:  BP 134/78 (BP Location: Right Arm, Patient Position: Sitting, Cuff Size: Normal)   Pulse 75   Ht 6' (1.829 m)   Wt 234 lb (106.1 kg)   SpO2 95%   BMI 31.74 kg/m     Wt Readings from Last 3 Encounters:  10/06/19 234 lb (106.1 kg)  09/09/19 236 lb (107 kg)  02/04/18 236 lb (107 kg)     GEN: Well nourished, well developed in no acute distress HEENT: Normal NECK: No JVD; No carotid bruits LYMPHATICS: No lymphadenopathy CARDIAC: S1S2 noted,RRR, no murmurs, rubs, gallops RESPIRATORY:  Clear to auscultation without rales, wheezing or rhonchi  ABDOMEN: Soft, non-tender, non-distended, +bowel  sounds, no guarding. EXTREMITIES: No edema, No cyanosis, no clubbing MUSCULOSKELETAL:  No deformity  SKIN: Warm and dry NEUROLOGIC:  Alert and oriented x 3, non-focal PSYCHIATRIC:  Normal affect, good insight  ASSESSMENT:    1. Precordial pain   2. Decreased cardiac ejection fraction   3. Atrial fibrillation, unspecified type ([place])   4. Nonrheumatic aortic valve insufficiency   5. Bilateral leg edema   6. Nonrheumatic mitral valve regurgitation    PLAN:     1. His chest pain is concerning given her risk factors with diabetes hypertension, hyperlipidemia I like to proceed with a Lexiscan in this patient will have him scheduled today  Educated the patient about this testing all of his questions have been answered. He does have nitroglycerin which was given to him by his PCP I have educated the patient again on how to take this medication. I have also advised the patient if the chest pain frequency increased to go to the emergency department.  Bilateral leg edema on his physical exam with history of mitral regurgitation and we will started patient on intermittent Lasix doses 20 mg Tuesdays and Saturdays with potassium supplement. Also get blood work with BMP, BNP and magnesium.  I have educated the patient about his mildly depressed ejection fraction 45 to 50% on his echocardiogram. In the meantime he is on beta-blocker.   He declines any start of anticoagulation, will continue to monitor. He understands the risk for increased risk of strokes with atrial fibrillation.  The patient is in agreement with the above plan. The patient left the office in stable condition.  The patient will follow up in 3 months or sooner if needed.   Medication Adjustments/Labs and Tests Ordered: Current medicines are reviewed at length with the patient today.  Concerns regarding medicines are outlined above.  Orders Placed This Encounter  Procedures  . Basic metabolic panel  . Brain natriuretic peptide    . Magnesium  . MYOCARDIAL PERFUSION IMAGING  . EKG 12-Lead   Meds ordered this encounter  Medications  . furosemide (LASIX) 20 MG tablet    Sig: Take 20 mg Lasix on Tuesday and Saturday.    Dispense:  36 tablet    Refill:  3  . potassium chloride SA (KLOR-CON) 20 MEQ tablet    Sig: Take 20 mg KCL on Tuesday and Saturday.    Dispense:  36 tablet    Refill:  3    Patient Instructions  Medication Instructions:  Your physician has recommended you make the following  change in your medication:   Take 20 mg Lasix and 20 mg KCL on Tuesday and Saturdays.  *If you need a refill on your cardiac medications before your next appointment, please call your pharmacy*   Lab Work: Your physician recommends that you have labs done in the office today. Your test included  basic metabolic panel, magnesium and BNP.  If you have labs (blood work) drawn today and your tests are completely normal, you will receive your results only by: Marland Kitchen MyChart Message (if you have MyChart) OR . A paper copy in the mail If you have any lab test that is abnormal or we need to change your treatment, we will call you to review the results.   Testing/Procedures: Your physician has requested that you have a lexiscan myoview. For further information please visit [email or web address]. Please follow instruction sheet, as given.  The test will take approximately 3 to 4 hours to complete; you may bring reading material.  If someone comes with you to your appointment, they will need to remain in the main lobby due to limited space in the testing area. *  How to prepare for your Myocardial Perfusion Test: . Do not eat or drink 3 hours prior to your test, except you may have water. . Do not consume products containing caffeine (regular or decaffeinated) 12 hours prior to your test. (ex: coffee, chocolate, sodas, tea). . Do bring a list of your current medications with you.  If not listed below, you may take your medications as  normal. . Do wear comfortable clothes (no dresses or overalls) and walking shoes, tennis shoes preferred (No heels or open toe shoes are allowed). . Do NOT wear cologne, perfume, aftershave, or lotions (deodorant is allowed). . If these instructions are not followed, your test will have to be rescheduled.    Follow-Up: At [facility], you and your health needs are our priority.  As part of our continuing mission to provide you with exceptional heart care, we have created designated Provider Care Teams.  These Care Teams include your primary Cardiologist (physician) and Advanced Practice Providers (APPs -  Physician Assistants and Nurse Practitioners) who all work together to provide you with the care you need, when you need it.  We recommend signing up for the patient portal called "MyChart".  Sign up information is provided on this After Visit Summary.  MyChart is used to connect with patients for Virtual Visits (Telemedicine).  Patients are able to view lab/test results, encounter notes, upcoming appointments, etc.  Non-urgent messages can be sent to your provider as well.   To learn more about what you can do with MyChart, go to [email or web address].    Your next appointment:   3 month(s)  The format for your next appointment:   In Person  Provider:   Berniece Salines, DO   Other Instructions Potassium Chloride Extended-Release Capsules What is this medicine? POTASSIUM CHLORIDE (poe TASS i um KLOOR ide) is a potassium supplement used to prevent and to treat low potassium. Potassium is important for the heart, muscles, and nerves. Too much or too little potassium in the body can cause serious problems. This medicine may be used for other purposes; ask your health care provider or pharmacist if you have questions. COMMON BRAND NAME(S): Klor-Con, Micro-K, Micro-K Extencaps What should I tell my health care provider before I take this medicine? They need to know if you have any of these  conditions:  Addison disease  dehydration  diabetes, high blood  sugar  difficulty swallowing  heart disease  high levels of potassium in the blood  irregular heartbeat or rhythm  kidney disease  large areas of burned skin  stomach ulcers, other stomach or intestine problems  an unusual or allergic reaction to potassium, other medicines, foods, dyes, or preservatives  pregnant or trying to get pregnant  breast-feeding How should I use this medicine? Take this drug by mouth with a glass of water. Take it as directed on the prescription label at the same time every day. Take it with food. Do not cut, crush, chew, or suck this drug. Swallow the capsules whole. You may open the capsule and put the contents in a teaspoon of soft food, such as applesauce or pudding. Do not add to hot foods. Swallow the mixture right away. Do not chew the mixture. Drink a glass of water or juice after taking the mixture. Keep taking this medicine unless your health care provider tells you to stop. Talk to your health care provider about the use of this drug in children. Special care may be needed. Overdosage: If you think you have taken too much of this medicine contact a poison control center or emergency room at once. NOTE: This medicine is only for you. Do not share this medicine with others. What if I miss a dose? If you miss a dose, take it as soon as you can. If it is almost time for your next dose, take only that dose. Do not take double or extra doses. What may interact with this medicine? Do not take this medicine with any of the following medications:  certain diuretics such as spironolactone, triamterene  certain medicines for stomach problems like atropine; difenoxin and glycopyrrolate  eplerenone  sodium polystyrene sulfonate This medicine may also interact with the following medications:  certain medicines for blood pressure or heart disease like lisinopril, losartan, quinapril,  valsartan  medicines that lower your chance of fighting infection such as cyclosporine, tacrolimus  NSAIDs, medicines for pain and inflammation, like ibuprofen or naproxen  other potassium supplements  salt substitutes This list may not describe all possible interactions. Give your health care provider a list of all the medicines, herbs, non-prescription drugs, or dietary supplements you use. Also tell them if you smoke, drink alcohol, or use illegal drugs. Some items may interact with your medicine. What should I watch for while using this medicine? Visit your doctor or health care professional for regular check ups. You will need lab work done regularly. You may need to be on a special diet while taking this medicine. Ask your doctor. What side effects may I notice from receiving this medicine? Side effects that you should report to your doctor or health care professional as soon as possible:  allergic reactions like skin rash, itching or hives, swelling of the face, lips, or tongue  black, tarry stools  breathing problems  confusion  heartburn  fast, irregular heartbeat  feeling faint or lightheaded, falls  low blood pressure  numbness or tingling in hands or feet  pain when swallowing  unusually weak or tired  weakness, heaviness of legs Side effects that usually do not require medical attention (report to your doctor or health care professional if they continue or are bothersome):  diarrhea  nausea, vomiting  stomach pain This list may not describe all possible side effects. Call your doctor for medical advice about side effects. You may report side effects to FDA at 1-800-FDA-1088. Where should I keep my medicine? Keep  out of the reach of children. Store at room temperature between 15 and 30 degrees C (59 and 86 degrees F ). Keep bottle closed tightly to protect this medicine from light and moisture. Throw away any unused medicine after the expiration  date. NOTE: This sheet is a summary. It may not cover all possible information. If you have questions about this medicine, talk to your doctor, pharmacist, or health care provider.  2020 Elsevier/Gold Standard (2018-10-29 16:43:28) Furosemide Oral Tablets What is this medicine? FUROSEMIDE (fyoor [place] se mide) is a diuretic. It helps you make more urine and to lose salt and excess water from your body. It treats swelling from heart, kidney, or liver disease. It also treats high blood pressure. This medicine may be used for other purposes; ask your health care provider or pharmacist if you have questions. COMMON BRAND NAME(S): Active-Medicated Specimen Kit, Delone, Diuscreen, Lasix, RX Specimen Collection Kit, Specimen Collection Kit, URINX Medicated Specimen Collection What should I tell my health care provider before I take this medicine? They need to know if you have any of these conditions:  abnormal blood electrolytes  diarrhea or vomiting  gout  heart disease  kidney disease, small amounts of urine, or difficulty passing urine  liver disease  thyroid disease  an unusual or allergic reaction to furosemide, sulfa drugs, other medicines, foods, dyes, or preservatives  pregnant or trying to get pregnant  breast-feeding How should I use this medicine? Take this drug by mouth. Take it as directed on the prescription label at the same time every day. You can take it with or without food. If it upsets your stomach, take it with food. Keep taking it unless your health care provider tells you to stop. Talk to your health care provider about the use of this drug in children. Special care may be needed. Overdosage: If you think you have taken too much of this medicine contact a poison control center or emergency room at once. NOTE: This medicine is only for you. Do not share this medicine with others. What if I miss a dose? If you miss a dose, take it as soon as you can. If it is almost  time for your next dose, take only that dose. Do not take double or extra doses. What may interact with this medicine?  aspirin and aspirin-like medicines  certain antibiotics  chloral hydrate  cisplatin  cyclosporine  digoxin  diuretics  laxatives  lithium  medicines for blood pressure  medicines that relax muscles for surgery  methotrexate  NSAIDs, medicines for pain and inflammation like ibuprofen, naproxen, or indomethacin  phenytoin  steroid medicines like prednisone or cortisone  sucralfate  thyroid hormones This list may not describe all possible interactions. Give your health care provider a list of all the medicines, herbs, non-prescription drugs, or dietary supplements you use. Also tell them if you smoke, drink alcohol, or use illegal drugs. Some items may interact with your medicine. What should I watch for while using this medicine? Visit your doctor or health care provider for regular checks on your progress. Check your blood pressure regularly. Ask your doctor or health care provider what your blood pressure should be, and when you should contact him or her. If you are a diabetic, check your blood sugar as directed. This medicine may cause serious skin reactions. They can happen weeks to months after starting the medicine. Contact your health care provider right away if you notice fevers or flu-like symptoms with a rash. The  rash may be red or purple and then turn into blisters or peeling of the skin. Or, you might notice a red rash with swelling of the face, lips or lymph nodes in your neck or under your arms. You may need to be on a special diet while taking this medicine. Check with your doctor. Also, ask how many glasses of fluid you need to drink a day. You must not get dehydrated. You may get drowsy or dizzy. Do not drive, use machinery, or do anything that needs mental alertness until you know how this drug affects you. Do not stand or sit up quickly,  especially if you are an older patient. This reduces the risk of dizzy or fainting spells. Alcohol can make you more drowsy and dizzy. Avoid alcoholic drinks. This medicine can make you more sensitive to the sun. Keep out of the sun. If you cannot avoid being in the sun, wear protective clothing and use sunscreen. Do not use sun lamps or tanning beds/booths. What side effects may I notice from receiving this medicine? Side effects that you should report to your doctor or health care professional as soon as possible:  blood in urine or stools  dry mouth  fever or chills  hearing loss or ringing in the ears  irregular heartbeat  muscle pain or weakness, cramps  rash, fever, and swollen lymph nodes  redness, blistering, peeling or loosening of the skin, including inside the mouth  skin rash  stomach upset, pain, or nausea  tingling or numbness in the hands or feet  unusually weak or tired  vomiting or diarrhea  yellowing of the eyes or skin Side effects that usually do not require medical attention (report to your doctor or health care professional if they continue or are bothersome):  headache  loss of appetite  unusual bleeding or bruising This list may not describe all possible side effects. Call your doctor for medical advice about side effects. You may report side effects to FDA at 1-800-FDA-1088. Where should I keep my medicine? Keep out of the reach of children and pets. Store at room temperature between 20 and 25 degrees C (68 and 77 degrees F). Protect from light and moisture. Keep the container tightly closed. Throw away any unused drug after the expiration date. NOTE: This sheet is a summary. It may not cover all possible information. If you have questions about this medicine, talk to your doctor, pharmacist, or health care provider.  2020 Elsevier/Gold Standard (2018-08-20 18:01:32)   Cardiac Nuclear Scan A cardiac nuclear scan is a test that is done to check  the flow of blood to your heart. It is done when you are resting and when you are exercising. The test looks for problems such as:  Not enough blood reaching a portion of the heart.  The heart muscle not working as it should. You may need this test if:  You have heart disease.  You have had lab results that are not normal.  You have had heart surgery or a balloon procedure to open up blocked arteries (angioplasty).  You have chest pain.  You have shortness of breath. In this test, a special dye (tracer) is put into your bloodstream. The tracer will travel to your heart. A camera will then take pictures of your heart to see how the tracer moves through your heart. This test is usually done at a hospital and takes 2-4 hours. Tell a doctor about:  Any allergies you have.  All medicines  you are taking, including vitamins, herbs, eye drops, creams, and over-the-counter medicines.  Any problems you or family members have had with anesthetic medicines.  Any blood disorders you have.  Any surgeries you have had.  Any medical conditions you have.  Whether you are pregnant or may be pregnant. What are the risks? Generally, this is a safe test. However, problems may occur, such as:  Serious chest pain and heart attack. This is only a risk if the stress portion of the test is done.  Rapid heartbeat.  A feeling of warmth in your chest. This feeling usually does not last long.  Allergic reaction to the tracer. What happens before the test?  Ask your doctor about changing or stopping your normal medicines. This is important.  Follow instructions from your doctor about what you cannot eat or drink.  Remove your jewelry on the day of the test. What happens during the test?  An IV tube will be inserted into one of your veins.  Your doctor will give you a small amount of tracer through the IV tube.  You will wait for 20-40 minutes while the tracer moves through your  bloodstream.  Your heart will be monitored with an electrocardiogram (ECG).  You will lie down on an exam table.  Pictures of your heart will be taken for about 15-20 minutes.  You may also have a stress test. For this test, one of these things may be done: ? You will be asked to exercise on a treadmill or a stationary bike. ? You will be given medicines that will make your heart work harder. This is done if you are unable to exercise.  When blood flow to your heart has peaked, a tracer will again be given through the IV tube.  After 20-40 minutes, you will get back on the exam table. More pictures will be taken of your heart.  Depending on the tracer that is used, more pictures may need to be taken 3-4 hours later.  Your IV tube will be removed when the test is over. The test may vary among doctors and hospitals. What happens after the test?  Ask your doctor: ? Whether you can return to your normal schedule, including diet, activities, and medicines. ? Whether you should drink more fluids. This will help to remove the tracer from your body. Drink enough fluid to keep your pee (urine) pale yellow.  Ask your doctor, or the department that is doing the test: ? When will my results be ready? ? How will I get my results? Summary  A cardiac nuclear scan is a test that is done to check the flow of blood to your heart.  Tell your doctor whether you are pregnant or may be pregnant.  Before the test, ask your doctor about changing or stopping your normal medicines. This is important.  Ask your doctor whether you can return to your normal activities. You may be asked to drink more fluids. This information is not intended to replace advice given to you by your health care provider. Make sure you discuss any questions you have with your health care provider. Document Revised: 04/24/2018 Document Reviewed: 06/18/2017 Elsevier Patient Education  [employer].      Adopting a  Healthy Lifestyle.  Know what a healthy weight is for you (roughly BMI <25) and aim to maintain this   Aim for 7+ servings of fruits and vegetables daily   65-80+ fluid ounces of water or unsweet tea for  healthy kidneys   Limit to max 1 drink of alcohol per day; avoid smoking/tobacco   Limit animal fats in diet for cholesterol and heart health - choose grass fed whenever available   Avoid highly processed foods, and foods high in saturated/trans fats   Aim for low stress - take time to unwind and care for your mental health   Aim for 150 min of moderate intensity exercise weekly for heart health, and weights twice weekly for bone health   Aim for 7-9 hours of sleep daily   When it comes to diets, agreement about the perfect plan isnt easy to find, even among the experts. Experts at the [employer] developed an idea known as the Healthy Eating Plate. Just imagine a plate divided into logical, healthy portions.   The emphasis is on diet quality:   Load up on vegetables and fruits - one-half of your plate: Aim for color and variety, and remember that potatoes dont count.   Go for whole grains - one-quarter of your plate: Whole wheat, barley, wheat berries, quinoa, oats, brown rice, and foods made with them. If you want pasta, go with whole wheat pasta.   Protein power - one-quarter of your plate: Fish, chicken, beans, and nuts are all healthy, versatile protein sources. Limit red meat.   The diet, however, does go beyond the plate, offering a few other suggestions.   Use healthy plant oils, such as olive, canola, soy, corn, sunflower and peanut. Check the labels, and avoid partially hydrogenated oil, which have unhealthy trans fats.   If youre thirsty, drink water. Coffee and tea are good in moderation, but skip sugary drinks and limit milk and dairy products to one or two daily servings.   The type of carbohydrate in the diet is more important than the  amount. Some sources of carbohydrates, such as vegetables, fruits, whole grains, and beans-are healthier than others.   Finally, stay active  Signed, Berniece Salines, DO  10/06/2019 12:33 PM    [facility]

## 2019-10-06 NOTE — Patient Instructions (Addendum)
Medication Instructions:  Your physician has recommended you make the following change in your medication:   Take 20 mg Lasix and 20 mg KCL on Tuesday and Saturdays.  *If you need a refill on your cardiac medications before your next appointment, please call your pharmacy*   Lab Work: Your physician recommends that you have labs done in the office today. Your test included  basic metabolic panel, magnesium and BNP.  If you have labs (blood work) drawn today and your tests are completely normal, you will receive your results only by: . MyChart Message (if you have MyChart) OR . A paper copy in the mail If you have any lab test that is abnormal or we need to change your treatment, we will call you to review the results.   Testing/Procedures: Your physician has requested that you have a lexiscan myoview. For further information please visit [email or web address]. Please follow instruction sheet, as given.  The test will take approximately 3 to 4 hours to complete; you may bring reading material.  If someone comes with you to your appointment, they will need to remain in the main lobby due to limited space in the testing area. *  How to prepare for your Myocardial Perfusion Test: . Do not eat or drink 3 hours prior to your test, except you may have water. . Do not consume products containing caffeine (regular or decaffeinated) 12 hours prior to your test. (ex: coffee, chocolate, sodas, tea). . Do bring a list of your current medications with you.  If not listed below, you may take your medications as normal. . Do wear comfortable clothes (no dresses or overalls) and walking shoes, tennis shoes preferred (No heels or open toe shoes are allowed). . Do NOT wear cologne, perfume, aftershave, or lotions (deodorant is allowed). . If these instructions are not followed, your test will have to be rescheduled.    Follow-Up: At [facility], you and your health needs are our priority.  As part of  our continuing mission to provide you with exceptional heart care, we have created designated Provider Care Teams.  These Care Teams include your primary Cardiologist (physician) and Advanced Practice Providers (APPs -  Physician Assistants and Nurse Practitioners) who all work together to provide you with the care you need, when you need it.  We recommend signing up for the patient portal called "MyChart".  Sign up information is provided on this After Visit Summary.  MyChart is used to connect with patients for Virtual Visits (Telemedicine).  Patients are able to view lab/test results, encounter notes, upcoming appointments, etc.  Non-urgent messages can be sent to your provider as well.   To learn more about what you can do with MyChart, go to [email or web address].    Your next appointment:   3 month(s)  The format for your next appointment:   In Person  Provider:   Kardie Tobb, DO   Other Instructions Potassium Chloride Extended-Release Capsules What is this medicine? POTASSIUM CHLORIDE (poe TASS i um KLOOR ide) is a potassium supplement used to prevent and to treat low potassium. Potassium is important for the heart, muscles, and nerves. Too much or too little potassium in the body can cause serious problems. This medicine may be used for other purposes; ask your health care provider or pharmacist if you have questions. COMMON BRAND NAME(S): Klor-Con, Micro-K, Micro-K Extencaps What should I tell my health care provider before I take this medicine? They need to know if you have any of these   conditions:  Addison disease  dehydration  diabetes, high blood sugar  difficulty swallowing  heart disease  high levels of potassium in the blood  irregular heartbeat or rhythm  kidney disease  large areas of burned skin  stomach ulcers, other stomach or intestine problems  an unusual or allergic reaction to potassium, other medicines, foods, dyes, or preservatives  pregnant  or trying to get pregnant  breast-feeding How should I use this medicine? Take this drug by mouth with a glass of water. Take it as directed on the prescription label at the same time every day. Take it with food. Do not cut, crush, chew, or suck this drug. Swallow the capsules whole. You may open the capsule and put the contents in a teaspoon of soft food, such as applesauce or pudding. Do not add to hot foods. Swallow the mixture right away. Do not chew the mixture. Drink a glass of water or juice after taking the mixture. Keep taking this medicine unless your health care provider tells you to stop. Talk to your health care provider about the use of this drug in children. Special care may be needed. Overdosage: If you think you have taken too much of this medicine contact a poison control center or emergency room at once. NOTE: This medicine is only for you. Do not share this medicine with others. What if I miss a dose? If you miss a dose, take it as soon as you can. If it is almost time for your next dose, take only that dose. Do not take double or extra doses. What may interact with this medicine? Do not take this medicine with any of the following medications:  certain diuretics such as spironolactone, triamterene  certain medicines for stomach problems like atropine; difenoxin and glycopyrrolate  eplerenone  sodium polystyrene sulfonate This medicine may also interact with the following medications:  certain medicines for blood pressure or heart disease like lisinopril, losartan, quinapril, valsartan  medicines that lower your chance of fighting infection such as cyclosporine, tacrolimus  NSAIDs, medicines for pain and inflammation, like ibuprofen or naproxen  other potassium supplements  salt substitutes This list may not describe all possible interactions. Give your health care provider a list of all the medicines, herbs, non-prescription drugs, or dietary supplements you use.  Also tell them if you smoke, drink alcohol, or use illegal drugs. Some items may interact with your medicine. What should I watch for while using this medicine? Visit your doctor or health care professional for regular check ups. You will need lab work done regularly. You may need to be on a special diet while taking this medicine. Ask your doctor. What side effects may I notice from receiving this medicine? Side effects that you should report to your doctor or health care professional as soon as possible:  allergic reactions like skin rash, itching or hives, swelling of the face, lips, or tongue  black, tarry stools  breathing problems  confusion  heartburn  fast, irregular heartbeat  feeling faint or lightheaded, falls  low blood pressure  numbness or tingling in hands or feet  pain when swallowing  unusually weak or tired  weakness, heaviness of legs Side effects that usually do not require medical attention (report to your doctor or health care professional if they continue or are bothersome):  diarrhea  nausea, vomiting  stomach pain This list may not describe all possible side effects. Call your doctor for medical advice about side effects. You may report side effects   to FDA at 1-800-FDA-1088. Where should I keep my medicine? Keep out of the reach of children. Store at room temperature between 15 and 30 degrees C (59 and 86 degrees F ). Keep bottle closed tightly to protect this medicine from light and moisture. Throw away any unused medicine after the expiration date. NOTE: This sheet is a summary. It may not cover all possible information. If you have questions about this medicine, talk to your doctor, pharmacist, or health care provider.  2020 Elsevier/Gold Standard (2018-10-29 16:43:28) Furosemide Oral Tablets What is this medicine? FUROSEMIDE (fyoor [place] se mide) is a diuretic. It helps you make more urine and to lose salt and excess water from your body. It  treats swelling from heart, kidney, or liver disease. It also treats high blood pressure. This medicine may be used for other purposes; ask your health care provider or pharmacist if you have questions. COMMON BRAND NAME(S): Active-Medicated Specimen Kit, Delone, Diuscreen, Lasix, RX Specimen Collection Kit, Specimen Collection Kit, URINX Medicated Specimen Collection What should I tell my health care provider before I take this medicine? They need to know if you have any of these conditions:  abnormal blood electrolytes  diarrhea or vomiting  gout  heart disease  kidney disease, small amounts of urine, or difficulty passing urine  liver disease  thyroid disease  an unusual or allergic reaction to furosemide, sulfa drugs, other medicines, foods, dyes, or preservatives  pregnant or trying to get pregnant  breast-feeding How should I use this medicine? Take this drug by mouth. Take it as directed on the prescription label at the same time every day. You can take it with or without food. If it upsets your stomach, take it with food. Keep taking it unless your health care provider tells you to stop. Talk to your health care provider about the use of this drug in children. Special care may be needed. Overdosage: If you think you have taken too much of this medicine contact a poison control center or emergency room at once. NOTE: This medicine is only for you. Do not share this medicine with others. What if I miss a dose? If you miss a dose, take it as soon as you can. If it is almost time for your next dose, take only that dose. Do not take double or extra doses. What may interact with this medicine?  aspirin and aspirin-like medicines  certain antibiotics  chloral hydrate  cisplatin  cyclosporine  digoxin  diuretics  laxatives  lithium  medicines for blood pressure  medicines that relax muscles for surgery  methotrexate  NSAIDs, medicines for pain and  inflammation like ibuprofen, naproxen, or indomethacin  phenytoin  steroid medicines like prednisone or cortisone  sucralfate  thyroid hormones This list may not describe all possible interactions. Give your health care provider a list of all the medicines, herbs, non-prescription drugs, or dietary supplements you use. Also tell them if you smoke, drink alcohol, or use illegal drugs. Some items may interact with your medicine. What should I watch for while using this medicine? Visit your doctor or health care provider for regular checks on your progress. Check your blood pressure regularly. Ask your doctor or health care provider what your blood pressure should be, and when you should contact him or her. If you are a diabetic, check your blood sugar as directed. This medicine may cause serious skin reactions. They can happen weeks to months after starting the medicine. Contact your health care provider right away   if you notice fevers or flu-like symptoms with a rash. The rash may be red or purple and then turn into blisters or peeling of the skin. Or, you might notice a red rash with swelling of the face, lips or lymph nodes in your neck or under your arms. You may need to be on a special diet while taking this medicine. Check with your doctor. Also, ask how many glasses of fluid you need to drink a day. You must not get dehydrated. You may get drowsy or dizzy. Do not drive, use machinery, or do anything that needs mental alertness until you know how this drug affects you. Do not stand or sit up quickly, especially if you are an older patient. This reduces the risk of dizzy or fainting spells. Alcohol can make you more drowsy and dizzy. Avoid alcoholic drinks. This medicine can make you more sensitive to the sun. Keep out of the sun. If you cannot avoid being in the sun, wear protective clothing and use sunscreen. Do not use sun lamps or tanning beds/booths. What side effects may I notice from  receiving this medicine? Side effects that you should report to your doctor or health care professional as soon as possible:  blood in urine or stools  dry mouth  fever or chills  hearing loss or ringing in the ears  irregular heartbeat  muscle pain or weakness, cramps  rash, fever, and swollen lymph nodes  redness, blistering, peeling or loosening of the skin, including inside the mouth  skin rash  stomach upset, pain, or nausea  tingling or numbness in the hands or feet  unusually weak or tired  vomiting or diarrhea  yellowing of the eyes or skin Side effects that usually do not require medical attention (report to your doctor or health care professional if they continue or are bothersome):  headache  loss of appetite  unusual bleeding or bruising This list may not describe all possible side effects. Call your doctor for medical advice about side effects. You may report side effects to FDA at 1-800-FDA-1088. Where should I keep my medicine? Keep out of the reach of children and pets. Store at room temperature between 20 and 25 degrees C (68 and 77 degrees F). Protect from light and moisture. Keep the container tightly closed. Throw away any unused drug after the expiration date. NOTE: This sheet is a summary. It may not cover all possible information. If you have questions about this medicine, talk to your doctor, pharmacist, or health care provider.  2020 Elsevier/Gold Standard (2018-08-20 18:01:32)   Cardiac Nuclear Scan A cardiac nuclear scan is a test that is done to check the flow of blood to your heart. It is done when you are resting and when you are exercising. The test looks for problems such as:  Not enough blood reaching a portion of the heart.  The heart muscle not working as it should. You may need this test if:  You have heart disease.  You have had lab results that are not normal.  You have had heart surgery or a balloon procedure to open up  blocked arteries (angioplasty).  You have chest pain.  You have shortness of breath. In this test, a special dye (tracer) is put into your bloodstream. The tracer will travel to your heart. A camera will then take pictures of your heart to see how the tracer moves through your heart. This test is usually done at a hospital and takes 2-4 hours. Tell  a doctor about:  Any allergies you have.  All medicines you are taking, including vitamins, herbs, eye drops, creams, and over-the-counter medicines.  Any problems you or family members have had with anesthetic medicines.  Any blood disorders you have.  Any surgeries you have had.  Any medical conditions you have.  Whether you are pregnant or may be pregnant. What are the risks? Generally, this is a safe test. However, problems may occur, such as:  Serious chest pain and heart attack. This is only a risk if the stress portion of the test is done.  Rapid heartbeat.  A feeling of warmth in your chest. This feeling usually does not last long.  Allergic reaction to the tracer. What happens before the test?  Ask your doctor about changing or stopping your normal medicines. This is important.  Follow instructions from your doctor about what you cannot eat or drink.  Remove your jewelry on the day of the test. What happens during the test?  An IV tube will be inserted into one of your veins.  Your doctor will give you a small amount of tracer through the IV tube.  You will wait for 20-40 minutes while the tracer moves through your bloodstream.  Your heart will be monitored with an electrocardiogram (ECG).  You will lie down on an exam table.  Pictures of your heart will be taken for about 15-20 minutes.  You may also have a stress test. For this test, one of these things may be done: ? You will be asked to exercise on a treadmill or a stationary bike. ? You will be given medicines that will make your heart work harder. This  is done if you are unable to exercise.  When blood flow to your heart has peaked, a tracer will again be given through the IV tube.  After 20-40 minutes, you will get back on the exam table. More pictures will be taken of your heart.  Depending on the tracer that is used, more pictures may need to be taken 3-4 hours later.  Your IV tube will be removed when the test is over. The test may vary among doctors and hospitals. What happens after the test?  Ask your doctor: ? Whether you can return to your normal schedule, including diet, activities, and medicines. ? Whether you should drink more fluids. This will help to remove the tracer from your body. Drink enough fluid to keep your pee (urine) pale yellow.  Ask your doctor, or the department that is doing the test: ? When will my results be ready? ? How will I get my results? Summary  A cardiac nuclear scan is a test that is done to check the flow of blood to your heart.  Tell your doctor whether you are pregnant or may be pregnant.  Before the test, ask your doctor about changing or stopping your normal medicines. This is important.  Ask your doctor whether you can return to your normal activities. You may be asked to drink more fluids. This information is not intended to replace advice given to you by your health care provider. Make sure you discuss any questions you have with your health care provider. Document Revised: 04/24/2018 Document Reviewed: 06/18/2017 Elsevier Patient Education  2020 Elsevier Inc.   

## 2019-10-07 ENCOUNTER — Telehealth (HOSPITAL_COMMUNITY): Payer: Self-pay | Admitting: *Deleted

## 2019-10-07 LAB — BASIC METABOLIC PANEL WITH GFR
BUN/Creatinine Ratio: 21 (ref 10–24)
BUN: 16 mg/dL (ref 8–27)
CO2: 26 mmol/L (ref 20–29)
Calcium: 9.2 mg/dL (ref 8.6–10.2)
Chloride: 102 mmol/L (ref 96–106)
Creatinine, Ser: 0.77 mg/dL (ref 0.76–1.27)
GFR calc Af Amer: 94 mL/min/1.73
GFR calc non Af Amer: 82 mL/min/1.73
Glucose: 108 mg/dL — ABNORMAL HIGH (ref 65–99)
Potassium: 4.4 mmol/L (ref 3.5–5.2)
Sodium: 140 mmol/L (ref 134–144)

## 2019-10-07 LAB — MAGNESIUM: Magnesium: 2.1 mg/dL (ref 1.6–2.3)

## 2019-10-07 LAB — BRAIN NATRIURETIC PEPTIDE: BNP: 102.9 pg/mL — ABNORMAL HIGH (ref 0.0–100.0)

## 2019-10-07 NOTE — Telephone Encounter (Signed)
Patient given detailed instructions per Myocardial Perfusion Study Information Sheet for the test on 10/08/19. Patient notified to arrive 15 minutes early and that it is imperative to arrive on time for appointment to keep from having the test rescheduled.  If you need to cancel or reschedule your appointment, please call the office within 24 hours of your appointment. . Patient verbalized understanding. Kirstie Peri

## 2019-10-08 ENCOUNTER — Other Ambulatory Visit: Payer: Self-pay

## 2019-10-08 ENCOUNTER — Ambulatory Visit (INDEPENDENT_AMBULATORY_CARE_PROVIDER_SITE_OTHER): Payer: Medicare Other

## 2019-10-08 DIAGNOSIS — R072 Precordial pain: Secondary | ICD-10-CM | POA: Diagnosis not present

## 2019-10-08 LAB — MYOCARDIAL PERFUSION IMAGING
Peak HR: 103 {beats}/min
Rest HR: 90 {beats}/min
SDS: 7
SRS: 3
SSS: 10
TID: 1.29

## 2019-10-08 MED ORDER — REGADENOSON 0.4 MG/5ML IV SOLN
0.4000 mg | Freq: Once | INTRAVENOUS | Status: AC
  Administered 2019-10-08: 0.4 mg via INTRAVENOUS

## 2019-10-08 MED ORDER — TECHNETIUM TC 99M TETROFOSMIN IV KIT
30.1000 | PACK | Freq: Once | INTRAVENOUS | Status: AC | PRN
  Administered 2019-10-08: 30.1 via INTRAVENOUS

## 2019-10-08 MED ORDER — TECHNETIUM TC 99M TETROFOSMIN IV KIT
10.7000 | PACK | Freq: Once | INTRAVENOUS | Status: AC | PRN
  Administered 2019-10-08: 10.7 via INTRAVENOUS

## 2019-10-09 DIAGNOSIS — M4800 Spinal stenosis, site unspecified: Secondary | ICD-10-CM | POA: Insufficient documentation

## 2019-10-09 DIAGNOSIS — S129XXA Fracture of neck, unspecified, initial encounter: Secondary | ICD-10-CM | POA: Insufficient documentation

## 2019-10-10 ENCOUNTER — Encounter: Payer: Self-pay | Admitting: Cardiology

## 2019-10-10 ENCOUNTER — Ambulatory Visit (INDEPENDENT_AMBULATORY_CARE_PROVIDER_SITE_OTHER): Payer: Medicare Other | Admitting: Cardiology

## 2019-10-10 ENCOUNTER — Other Ambulatory Visit: Payer: Self-pay

## 2019-10-10 VITALS — BP 139/69 | HR 82 | Ht 72.0 in | Wt 235.6 lb

## 2019-10-10 DIAGNOSIS — R9439 Abnormal result of other cardiovascular function study: Secondary | ICD-10-CM

## 2019-10-10 DIAGNOSIS — E1142 Type 2 diabetes mellitus with diabetic polyneuropathy: Secondary | ICD-10-CM | POA: Diagnosis not present

## 2019-10-10 DIAGNOSIS — E782 Mixed hyperlipidemia: Secondary | ICD-10-CM

## 2019-10-10 DIAGNOSIS — I4819 Other persistent atrial fibrillation: Secondary | ICD-10-CM

## 2019-10-10 DIAGNOSIS — I10 Essential (primary) hypertension: Secondary | ICD-10-CM | POA: Diagnosis not present

## 2019-10-10 LAB — BASIC METABOLIC PANEL
BUN/Creatinine Ratio: 22 (ref 10–24)
BUN: 17 mg/dL (ref 8–27)
CO2: 23 mmol/L (ref 20–29)
Calcium: 8.8 mg/dL (ref 8.6–10.2)
Chloride: 103 mmol/L (ref 96–106)
Creatinine, Ser: 0.76 mg/dL (ref 0.76–1.27)
GFR calc Af Amer: 95 mL/min/{1.73_m2} (ref >59)
GFR calc non Af Amer: 82 mL/min/{1.73_m2} (ref >59)
Glucose: 174 mg/dL — ABNORMAL HIGH (ref 65–99)
Potassium: 4.2 mmol/L (ref 3.5–5.2)
Sodium: 139 mmol/L (ref 134–144)

## 2019-10-10 LAB — CBC
Hematocrit: 40.9 % (ref 37.5–51.0)
Hemoglobin: 13.5 g/dL (ref 13.0–17.7)
MCH: 31.8 pg (ref 26.6–33.0)
MCHC: 33 g/dL (ref 31.5–35.7)
MCV: 96 fL (ref 79–97)
Platelets: 175 10*3/uL (ref 150–450)
RBC: 4.25 x10E6/uL (ref 4.14–5.80)
RDW: 11.6 % (ref 11.6–15.4)
WBC: 6.3 10*3/uL (ref 3.4–10.8)

## 2019-10-10 MED ORDER — ATORVASTATIN CALCIUM 20 MG PO TABS: 20.0000 mg | ORAL_TABLET | Freq: Every day | ORAL | 3 refills | Status: DC

## 2019-10-10 NOTE — Patient Instructions (Addendum)
Medication Instructions:  1) Start  Aspirin 81 mg daily   2) Start Atorvastatin 20 mg daily  *If you need a refill on your cardiac medications before your next appointment, please call your pharmacy*   Lab Work: [place], [place] are scheduled for your covid test on 10/13/19 at 11:40 AM at [place] [address]. This is a drive up so please remain in your car and someone will direct you to the appropriate line.   If you have labs (blood work) drawn today and your tests are completely normal, you will receive your results only by: Marland Kitchen MyChart Message (if you have MyChart) OR . A paper copy in the mail If you have any lab test that is abnormal or we need to change your treatment, we will call you to review the results.   Testing/Procedures: Your physician has requested that you have a cardiac catheterization. Cardiac catheterization is used to diagnose and/or treat various heart conditions. Doctors may recommend this procedure for a number of different reasons. The most common reason is to evaluate chest pain. Chest pain can be a symptom of coronary artery disease (CAD), and cardiac catheterization can show whether plaque is narrowing or blocking your heart's arteries. This procedure is also used to evaluate the valves, as well as measure the blood flow and oxygen levels in different parts of your heart. For further information please visit [email or web address]. Please follow instruction sheet, as given.     Follow-Up: At [facility], you and your health needs are our priority.  As part of our continuing mission to provide you with exceptional heart care, we have created designated Provider Care Teams.  These Care Teams include your primary Cardiologist (physician) and Advanced Practice Providers (APPs -  Physician Assistants and Nurse Practitioners) who all work together to provide you with the care you need, when you need it.  We recommend signing up for the patient  portal called "MyChart".  Sign up information is provided on this After Visit Summary.  MyChart is used to connect with patients for Virtual Visits (Telemedicine).  Patients are able to view lab/test results, encounter notes, upcoming appointments, etc.  Non-urgent messages can be sent to your provider as well.   To learn more about what you can do with MyChart, go to [email or web address].    Your next appointment:   2 week(s)  The format for your next appointment:   In Person  Provider:   Berniece Salines, DO   Other Instructions    [place] [address] Dept: [phone] Loc: [place]  10/10/2019  You are scheduled for a Cardiac Catheterization on Wednesday, September 29 with Dr. Sherren Mocha.  1. Please arrive at the [facility] (Main Entrance A) at [facility]: [address] [place], [place] [address] at 11:00 AM (This time is two hours before your procedure to ensure your preparation). Free valet parking service is available.   Special note: Every effort is made to have your procedure done on time. Please understand that emergencies sometimes delay scheduled procedures.  2. Diet: Do not eat solid foods after midnight.  The patient may have clear liquids until 5am upon the day of the procedure.   3. Medication instructions in preparation for your procedure:  Hold Lasix the morning of your procedure   On the morning of your procedure, take your  Aspirin and any morning medicines NOT listed above.  You may use sips of water.  5. Plan for one night stay--bring personal belongings. 6. Bring a current list of your medications and current insurance cards. 7. You MUST have a responsible person to drive you home. 8. Someone MUST be with you the first 24 hours after you arrive home or your discharge will be delayed. 9. Please wear clothes that are  easy to get on and off and wear slip-on shoes.  Thank you for allowing [place] to care for you!   -- [facility]

## 2019-10-10 NOTE — Progress Notes (Signed)
Cardiology Office Note:    Date:  10/10/2019   ID:  Keith Mccall, DOB 1930/04/20, MRN [id number]  PCP:  Ernestene Kiel, MD  Cardiologist:  Berniece Salines, DO  Electrophysiologist:  None   Referring MD: Ernestene Kiel, MD   Follow-up to discuss stress test.  History of Present Illness:    Keith Mccall is a 86 y.o. male with a hx of permanent atrial fibrillation on metoprolol however patient reports that he had declined anticoagulation and prefers not to take any anticoagulation, diabetes type 2, hypertension, hyperlipidemia.  I did see the patient October 06, 2019 at that time he was experiencing chest pain shortness of breath.  Given his risk factor the patient was planned for stress test.  He was able to get his stress test and is here today to discuss result.  Past Medical History:  Diagnosis Date  . Anxiety   . Atrial fibrillation ([place])   . Chest pain in adult 12/07/2017  . Comprehensive diabetic foot examination, type 2 DM, encounter for ([place]) 04/10/2019  . Compression fracture of C-spine ([place])   . Contusion of right great toe without damage to nail 04/10/2019  . Diabetic polyneuropathy associated with type 2 diabetes mellitus ([place]) 04/10/2019  . Essential hypertension 12/07/2017  . Hallux hammertoe, right 04/10/2019  . Mixed hyperlipidemia 12/07/2017  . Nonrheumatic aortic valve insufficiency 12/07/2017  . Nonrheumatic mitral valve regurgitation 12/07/2017  . Paroxysmal A-fib ([place]) 12/05/2017  . PVC (premature ventricular contraction) 12/05/2017  . Sick sinus syndrome ([place]) 12/07/2017  . Spinal stenosis     Past Surgical History:  Procedure Laterality Date  . HERNIA REPAIR      Current Medications: Current Meds  Medication Sig  . ACCU-CHEK AVIVA PLUS test strip USE TO CHECK BLOOD SUGAR TWICE A DAY  . ALPRAZolam (XANAX) 1 MG tablet Take 1 mg by mouth 2 (two) times daily as needed.    Marland Kitchen co-enzyme Q-10 30 MG capsule Take 30 mg by mouth 3 (three) times daily.    . Flaxseed, Linseed, (FLAXSEED OIL PO) Take by mouth once a week. 1 tablespoon  . Hawthorne Berry 500 MG CAPS Take by mouth at bedtime.    . metoprolol tartrate (LOPRESSOR) 25 MG tablet Take 12.5 mg by mouth 2 (two) times daily.    . nitroGLYCERIN (NITROSTAT) 0.4 MG SL tablet Place under the tongue as needed for chest pain.  . Omega-3 Fatty Acids (SUPER OMEGA 3 PO) Take by mouth once a week. Cod liver oil  . Probiotic Product (PROBIOTIC PO) Take by mouth daily.    . Taurine 1000 MG CAPS Take 2 capsules by mouth daily.    Marland Kitchen Ubiquinol 50 MG CAPS Take by mouth daily.       Allergies:   Amiodarone, Erythromycin base, Polycarbophil, Penicillins, and Sulfa antibiotics   Social History   Socioeconomic History  . Marital status: Married    Spouse name: Not on file  . Number of children: 3  . Years of education: Not on file  . Highest education level: Not on file  Occupational History  . Occupation: retired  Tobacco Use  . Smoking status: Never Smoker  . Smokeless tobacco: Never Used  Substance and Sexual Activity  . Alcohol use: No  . Drug use: No  . Sexual activity: Not on file  Other Topics Concern  . Not on file  Social History Narrative  . Not on file   Social Determinants of Health   Financial Resource Strain:   .  Difficulty of Paying Living Expenses: Not on file  Food Insecurity:   . Worried About Charity fundraiser in the Last Year: Not on file  . Ran Out of Food in the Last Year: Not on file  Transportation Needs:   . Lack of Transportation (Medical): Not on file  . Lack of Transportation (Non-Medical): Not on file  Physical Activity:   . Days of Exercise per Week: Not on file  . Minutes of Exercise per Session: Not on file  Stress:   . Feeling of Stress : Not on file  Social Connections:   . Frequency of Communication with Friends and Family: Not on file  . Frequency of Social Gatherings with Friends and Family: Not on file  . Attends Religious Services: Not on  file  . Active Member of Clubs or Organizations: Not on file  . Attends Archivist Meetings: Not on file  . Marital Status: Not on file     Family History: The patient's family history includes Breast cancer in his sister; CAD in his father; Coronary artery disease in his father; Diabetes in his maternal grandmother, mother, and sister; GI Bleed in his sister; Heart attack in his mother; Heart disease in his mother; Hyperlipidemia in his sister; Hypertension in his father; Lung cancer in his mother; Migraines in his child; Stroke in his father.  ROS:   Review of Systems  Constitution: Negative for decreased appetite, fever and weight gain.  HENT: Negative for congestion, ear discharge, hoarse voice and sore throat.   Eyes: Negative for discharge, redness, vision loss in right eye and visual halos.  Cardiovascular: Negative for chest pain, dyspnea on exertion, leg swelling, orthopnea and palpitations.  Respiratory: Negative for cough, hemoptysis, shortness of breath and snoring.   Endocrine: Negative for heat intolerance and polyphagia.  Hematologic/Lymphatic: Negative for bleeding problem. Does not bruise/bleed easily.  Skin: Negative for flushing, nail changes, rash and suspicious lesions.  Musculoskeletal: Negative for arthritis, joint pain, muscle cramps, myalgias, neck pain and stiffness.  Gastrointestinal: Negative for abdominal pain, bowel incontinence, diarrhea and excessive appetite.  Genitourinary: Negative for decreased libido, genital sores and incomplete emptying.  Neurological: Negative for brief paralysis, focal weakness, headaches and loss of balance.  Psychiatric/Behavioral: Negative for altered mental status, depression and suicidal ideas.  Allergic/Immunologic: Negative for HIV exposure and persistent infections.    EKGs/Labs/Other Studies Reviewed:    The following studies were reviewed today:   EKG: None today  Pharmacologic stress test  There was no  ST segment deviation noted during stress.  No T wave inversion was noted during stress.  Defect 1: There is a medium defect of mild severity present in the basal inferoseptal location.  Findings consistent with prior myocardial infarction with peri-infarct ischemia.  This is an intermediate risk study.  Non-gated gated study therefore can not determine wall motion abnormalities. With a dilated left ventricular cavity and abnormal TID the study is a intermediate risk. Clinical correlate as patient may need further testing with left heart catheterization.    His echocardiogram report was a bit confusing given the fact that it was reporting normal EF and secondly some moderate to severely impaired LV function. I therefore called Dr. Bettina Gavia who was at the hospital to review this imaging with me and based on his assessment patient does have an EF of 45 to 50%. Mild aortic stenosis. Severely dilated left atrium by volume. Moderate mitral regurgitation is present. Mitral has regurgitation present.  Recent Labs: 10/06/2019: BNP  102.9; BUN 16; Creatinine, Ser 0.77; Magnesium 2.1; Potassium 4.4; Sodium 140  Recent Lipid Panel No results found for: CHOL, TRIG, HDL, CHOLHDL, VLDL, LDLCALC, LDLDIRECT  Physical Exam:    VS:  BP 139/69   Pulse 82   Ht 6' (1.829 m)   Wt 235 lb 9.6 oz (106.9 kg)   SpO2 94%   BMI 31.95 kg/m     Wt Readings from Last 3 Encounters:  10/10/19 235 lb 9.6 oz (106.9 kg)  10/08/19 234 lb (106.1 kg)  10/06/19 234 lb (106.1 kg)     GEN: Well nourished, well developed in no acute distress HEENT: Normal NECK: No JVD; No carotid bruits LYMPHATICS: No lymphadenopathy CARDIAC: S1S2 noted,RRR, no murmurs, rubs, gallops RESPIRATORY:  Clear to auscultation without rales, wheezing or rhonchi  ABDOMEN: Soft, non-tender, non-distended, +bowel sounds, no guarding. EXTREMITIES: No edema, No cyanosis, no clubbing MUSCULOSKELETAL:  No deformity  SKIN: Warm and dry NEUROLOGIC:   Alert and oriented x 3, non-focal PSYCHIATRIC:  Normal affect, good insight  ASSESSMENT:    1. Abnormal stress test   2. Essential hypertension   3. Diabetic polyneuropathy associated with type 2 diabetes mellitus ([place])   4. Mixed hyperlipidemia   5. Persistent atrial fibrillation ([facility])    PLAN:    We discussed the abnormal pharmacologic nuclear stress test which showed peri-infarct ischemia in the basal septal wall with abnormal transient ischemic dilatation.  The patient has a lot of questions which I was able to answer for him.  We also discussed possible options of medical management versus proceeding with left heart catheterization.  He prefers to proceed with a left heart catheterization at this time.  The patient understands that risks include but are not limited to stroke (1 in 1000), death (1 in 50), kidney failure [usually temporary] (1 in 500), bleeding (1 in 200), allergic reaction [possibly serious] (1 in 200), and agrees to proceed.  We will start aspirin 81 mg daily and Lipitor 40 mg daily.  He has no history of any bleeding.  In addition he does have history of spinal stenosis.  Which he tells me that if he takes Tylenol he will be able to lie flat during his procedure.  Along with a history of significant anxiety which is controlled with Xanax.  Hypertension his blood pressure deceptively in the office no changes will be made to his medication regimen.  He had not started his Lasix he also had questions about this medication again I was able to answer his questions.  Diabetes mellitus per his PCP.  Atrial fibrillation he is not on any anticoagulation continue metoprolol.   The patient is in agreement with the above plan. The patient left the office in stable condition.  The patient will follow up in   Medication Adjustments/Labs and Tests Ordered: Current medicines are reviewed at length with the patient today.  Concerns regarding medicines are outlined above.    No orders of the defined types were placed in this encounter.  No orders of the defined types were placed in this encounter.   There are no Patient Instructions on file for this visit.   Adopting a Healthy Lifestyle.  Know what a healthy weight is for you (roughly BMI <25) and aim to maintain this   Aim for 7+ servings of fruits and vegetables daily   65-80+ fluid ounces of water or unsweet tea for healthy kidneys   Limit to max 1 drink of alcohol per day; avoid smoking/tobacco  Limit animal fats in diet for cholesterol and heart health - choose grass fed whenever available   Avoid highly processed foods, and foods high in saturated/trans fats   Aim for low stress - take time to unwind and care for your mental health   Aim for 150 min of moderate intensity exercise weekly for heart health, and weights twice weekly for bone health   Aim for 7-9 hours of sleep daily   When it comes to diets, agreement about the perfect plan isnt easy to find, even among the experts. Experts at the [employer] developed an idea known as the Healthy Eating Plate. Just imagine a plate divided into logical, healthy portions.   The emphasis is on diet quality:   Load up on vegetables and fruits - one-half of your plate: Aim for color and variety, and remember that potatoes dont count.   Go for whole grains - one-quarter of your plate: Whole wheat, barley, wheat berries, quinoa, oats, brown rice, and foods made with them. If you want pasta, go with whole wheat pasta.   Protein power - one-quarter of your plate: Fish, chicken, beans, and nuts are all healthy, versatile protein sources. Limit red meat.   The diet, however, does go beyond the plate, offering a few other suggestions.   Use healthy plant oils, such as olive, canola, soy, corn, sunflower and peanut. Check the labels, and avoid partially hydrogenated oil, which have unhealthy trans fats.   If youre thirsty, drink  water. Coffee and tea are good in moderation, but skip sugary drinks and limit milk and dairy products to one or two daily servings.   The type of carbohydrate in the diet is more important than the amount. Some sources of carbohydrates, such as vegetables, fruits, whole grains, and beans-are healthier than others.   Finally, stay active  Signed, Berniece Salines, DO  10/10/2019 9:40 AM    [place]

## 2019-10-10 NOTE — Progress Notes (Signed)
Blood work normal.  Stable

## 2019-10-13 ENCOUNTER — Telehealth: Payer: Self-pay | Admitting: Cardiovascular Disease

## 2019-10-13 ENCOUNTER — Other Ambulatory Visit (HOSPITAL_COMMUNITY): Payer: Medicare Other

## 2019-10-13 NOTE — Telephone Encounter (Signed)
Left message for patient to return call.

## 2019-10-13 NOTE — Telephone Encounter (Signed)
    Pt's wife would like to r/s heart cath procedure

## 2019-10-13 NOTE — Telephone Encounter (Signed)
Fu Message   Pt wife was returning call.    Best cb number is [phone]

## 2019-10-13 NOTE — Telephone Encounter (Signed)
Called patient wife back. She reports they will need to post pone the patients cardiac catheterization due to his spinal stenosis flare up that is causing him pain. They reported that they were not aware of what all the procedure would entail and are not sure the patient would be able to tolerate it with his current pain. They were asking for a consultation with Dr. Burt Knack to fully understand the cath I explained typically the last appointment with primary cardiologist serves as this consultation and the procedure is explained then. Furthermore they are till wanting to post pone the procedure. They were told Dr. Harriet Masson will be calling them. Will check with her. He is supposed to go for pre procedural covid test at 1140 so we have not cancelled anything until the patient speaks with Dr. Harriet Masson to make sure he hasn't changed his mind. The number to reach the patient is [phone].

## 2019-10-13 NOTE — Telephone Encounter (Signed)
I was able to speak with patient. He wishes to cancel his LHC as he concern that he may not be able to stay flat. He had several questions again about his procedure and why it was recommended. I explain the indication to the patient and as we discussed in the office about his stress test. For now his preference is to continue with medical management. He will follow up as already schedule

## 2019-10-13 NOTE — Telephone Encounter (Signed)
Called patient informed him Dr. Harriet Masson is in clinic and will call him when she is able to. Patient understood. He asked to go ahead and cancel his cath and covid test as he still has unanswered questions about procedure and doesn't want to go through with it since he is in a lot of pain and doesn't think he will be able to lay down for procedure. Will inform Dr. Harriet Masson patient will wait to hear from her.

## 2019-10-15 ENCOUNTER — Encounter (HOSPITAL_COMMUNITY): Admission: RE | Payer: Self-pay | Source: Ambulatory Visit

## 2019-10-15 ENCOUNTER — Ambulatory Visit (HOSPITAL_COMMUNITY): Admission: RE | Admit: 2019-10-15 | Payer: Medicare Other | Source: Ambulatory Visit | Admitting: Cardiovascular Disease

## 2019-10-15 SURGERY — LEFT HEART CATH AND CORONARY ANGIOGRAPHY
Anesthesia: LOCAL

## 2019-10-20 DIAGNOSIS — R351 Nocturia: Secondary | ICD-10-CM | POA: Diagnosis not present

## 2019-10-20 DIAGNOSIS — N200 Calculus of kidney: Secondary | ICD-10-CM | POA: Diagnosis not present

## 2019-10-20 DIAGNOSIS — N401 Enlarged prostate with lower urinary tract symptoms: Secondary | ICD-10-CM | POA: Diagnosis not present

## 2019-10-20 NOTE — Telephone Encounter (Signed)
Pt will reschedule on Friday at his appointment. Pt states that he has 2 other appointments this week and feels it will be better to wait to schedule at that time. Dr. Harriet Masson is aware.

## 2019-10-23 DIAGNOSIS — S32020A Wedge compression fracture of second lumbar vertebra, initial encounter for closed fracture: Secondary | ICD-10-CM | POA: Diagnosis not present

## 2019-10-24 ENCOUNTER — Encounter: Payer: Self-pay | Admitting: Cardiology

## 2019-10-24 ENCOUNTER — Ambulatory Visit (INDEPENDENT_AMBULATORY_CARE_PROVIDER_SITE_OTHER): Payer: Medicare Other | Admitting: Cardiology

## 2019-10-24 ENCOUNTER — Other Ambulatory Visit: Payer: Self-pay

## 2019-10-24 VITALS — BP 140/74 | HR 68 | Ht 72.0 in | Wt 234.2 lb

## 2019-10-24 DIAGNOSIS — E1142 Type 2 diabetes mellitus with diabetic polyneuropathy: Secondary | ICD-10-CM | POA: Diagnosis not present

## 2019-10-24 DIAGNOSIS — I10 Essential (primary) hypertension: Secondary | ICD-10-CM

## 2019-10-24 DIAGNOSIS — I340 Nonrheumatic mitral (valve) insufficiency: Secondary | ICD-10-CM

## 2019-10-24 DIAGNOSIS — I480 Paroxysmal atrial fibrillation: Secondary | ICD-10-CM

## 2019-10-24 DIAGNOSIS — I351 Nonrheumatic aortic (valve) insufficiency: Secondary | ICD-10-CM | POA: Diagnosis not present

## 2019-10-24 DIAGNOSIS — E782 Mixed hyperlipidemia: Secondary | ICD-10-CM

## 2019-10-24 DIAGNOSIS — R9439 Abnormal result of other cardiovascular function study: Secondary | ICD-10-CM | POA: Diagnosis not present

## 2019-10-24 NOTE — Patient Instructions (Signed)

## 2019-10-24 NOTE — Progress Notes (Signed)
Cardiology Office Note:    Date:  10/24/2019   ID:  Keith Mccall, DOB Dec 12, 1930, MRN [id number]  PCP:  Ernestene Kiel, MD  Cardiologist:  Berniece Salines, DO  Electrophysiologist:  None   Referring MD: Ernestene Kiel, MD   " I am having some improvement from my back"  History of Present Illness:    Keith Mccall is a 86 y.o. male with a hx of permanent atrial fibrillation on metoprolol however patient reports that he had declined anticoagulation and prefers not to take any anticoagulation, diabetes type 2, hypertension, hyperlipidemia.  I did see the the patient on 10/10/2019 at that time we discussed his stress test results with plans for possible left heart cath. But the patient had to defer the procedure due to significant back pain and his inability to be able to lay flat for the procedure.   In the interim he was able to see his orthopedic surgeon and was told that he does have compound fracture which is the source of his pain. He reports that he would like to get the [facility]. He is working with his orthopedic surgeon for pain control.   No other complaints at this.  Past Medical History:  Diagnosis Date   Anxiety    Atrial fibrillation ([place])    Chest pain in adult 12/07/2017   Comprehensive diabetic foot examination, type 2 DM, encounter for ([place]) 04/10/2019   Compression fracture of C-spine ([place])    Contusion of right great toe without damage to nail 04/10/2019   Diabetic polyneuropathy associated with type 2 diabetes mellitus ([place]) 04/10/2019   Essential hypertension 12/07/2017   Hallux hammertoe, right 04/10/2019   Mixed hyperlipidemia 12/07/2017   Nonrheumatic aortic valve insufficiency 12/07/2017   Nonrheumatic mitral valve regurgitation 12/07/2017   Paroxysmal A-fib ([place]) 12/05/2017   PVC (premature ventricular contraction) 12/05/2017   Sick sinus syndrome ([place]) 12/07/2017   Spinal stenosis     Past Surgical History:  Procedure Laterality Date    HERNIA REPAIR      Current Medications: Current Meds  Medication Sig   ACCU-CHEK AVIVA PLUS test strip USE TO CHECK BLOOD SUGAR TWICE A DAY   acetaminophen (TYLENOL) 500 MG tablet Take 1,000 mg by mouth every 6 (six) hours as needed for mild pain. Rapid release   ALPRAZolam (XANAX) 1 MG tablet Take 1 mg by mouth 2 (two) times daily as needed for anxiety.    co-enzyme Q-10 30 MG capsule Take 30 mg by mouth 3 (three) times daily.   DHA-EPA-Vit B6-B12-Folic Acid (CARDIOVID PLUS) CAPS Take 2 capsules by mouth 3 (three) times daily with meals.   Elderberry 575 MG/5ML SYRP Take 5 mLs by mouth daily. Extract   furosemide (LASIX) 20 MG tablet Take 20 mg Lasix on Tuesday and Saturday. (Patient taking differently: Take 20 mg by mouth See admin instructions. Take 20 mg Tuesday and Saturday.)   [place] Berry 500 MG CAPS Take 500 mg by mouth daily.    metoprolol tartrate (LOPRESSOR) 25 MG tablet Take 12.5 mg by mouth 2 (two) times daily.     nitroGLYCERIN (NITROSTAT) 0.4 MG SL tablet Place 0.4 mg under the tongue every 5 (five) minutes as needed for chest pain.    OVER THE COUNTER MEDICATION Take 2 tablets by mouth 3 (three) times daily with meals. Diaplex   OVER THE COUNTER MEDICATION Take 1 tablet by mouth 2 (two) times daily with a meal. pancreatrophin pmg   OVER THE COUNTER MEDICATION Take 1 tablet by  mouth 3 (three) times daily with meals. livaplex   OVER THE COUNTER MEDICATION Take 1-5 tablets by mouth See admin instructions. cataplex e2  Daily and need for tightness in chest or shortness of breath can chew up to 5 tables   Probiotic Product (PROBIOTIC PO) Take 1 tablet by mouth daily. 50 Billion   Taurine 1000 MG CAPS Take 1,000 mg by mouth daily.    Ubiquinol 50 MG CAPS Take 50 mg by mouth daily.      Allergies:   Amiodarone, Erythromycin base, Polycarbophil, Penicillins, and Sulfa antibiotics   Social History   Socioeconomic History   Marital status: Married    Spouse  name: Not on file   Number of children: 3   Years of education: Not on file   Highest education level: Not on file  Occupational History   Occupation: retired  Tobacco Use   Smoking status: Never Smoker   Smokeless tobacco: Never Used  Substance and Sexual Activity   Alcohol use: No   Drug use: No   Sexual activity: Not on file  Other Topics Concern   Not on file  Social History Narrative   Not on file   Social Determinants of Health   Financial Resource Strain:    Difficulty of Paying Living Expenses: Not on file  Food Insecurity:    Worried About Charity fundraiser in the Last Year: Not on file   [place] in the Last Year: Not on file  Transportation Needs:    Lack of Transportation (Medical): Not on file   Lack of Transportation (Non-Medical): Not on file  Physical Activity:    Days of Exercise per Week: Not on file   Minutes of Exercise per Session: Not on file  Stress:    Feeling of Stress : Not on file  Social Connections:    Frequency of Communication with Friends and Family: Not on file   Frequency of Social Gatherings with Friends and Family: Not on file   Attends Religious Services: Not on file   Active Member of Clubs or Organizations: Not on file   Attends Archivist Meetings: Not on file   Marital Status: Not on file     Family History: The patient's family history includes Breast cancer in his sister; CAD in his father; Coronary artery disease in his father; Diabetes in his maternal grandmother, mother, and sister; GI Bleed in his sister; Heart attack in his mother; Heart disease in his mother; Hyperlipidemia in his sister; Hypertension in his father; Lung cancer in his mother; Migraines in his child; Stroke in his father.  ROS:   Review of Systems  Constitution: Negative for decreased appetite, fever and weight gain.  HENT: Negative for congestion, ear discharge, hoarse voice and sore throat.   Eyes: Negative  for discharge, redness, vision loss in right eye and visual halos.  Cardiovascular: Negative for chest pain, dyspnea on exertion, leg swelling, orthopnea and palpitations.  Respiratory: Negative for cough, hemoptysis, shortness of breath and snoring.   Endocrine: Negative for heat intolerance and polyphagia.  Hematologic/Lymphatic: Negative for bleeding problem. Does not bruise/bleed easily.  Skin: Negative for flushing, nail changes, rash and suspicious lesions.  Musculoskeletal: Negative for arthritis, joint pain, muscle cramps, myalgias, neck pain and stiffness.  Gastrointestinal: Negative for abdominal pain, bowel incontinence, diarrhea and excessive appetite.  Genitourinary: Negative for decreased libido, genital sores and incomplete emptying.  Neurological: Negative for brief paralysis, focal weakness, headaches and loss of balance.  Psychiatric/Behavioral: Negative for altered mental status, depression and suicidal ideas.  Allergic/Immunologic: Negative for HIV exposure and persistent infections.    EKGs/Labs/Other Studies Reviewed:    The following studies were reviewed today:   EKG:  None today  Pharmacologic stress test  There was no ST segment deviation noted during stress.  No T wave inversion was noted during stress.  Defect 1: There is a medium defect of mild severity present in the basal inferoseptal location.  Findings consistent with prior myocardial infarction with peri-infarct ischemia.  This is an intermediate risk study.  Non-gated gated study therefore can not determine wall motion abnormalities. With a dilated left ventricular cavity and abnormal TID the study is a intermediate risk. Clinical correlate as patient may need further testing with left heart catheterization.   His echocardiogram report was a bit confusing given the fact that it was reporting normal EF and secondly some moderate to severely impaired LV function. I therefore called Dr. Bettina Gavia who  was at the hospital to review this imaging with me and based on his assessment patient does have an EF of 45 to 50%. Mild aortic stenosis. Severely dilated left atrium by volume. Moderate mitral regurgitation is present. Mitral has regurgitation present.  Recent Labs: 10/06/2019: BNP 102.9; Magnesium 2.1 10/10/2019: BUN 17; Creatinine, Ser 0.76; Hemoglobin 13.5; Platelets 175; Potassium 4.2; Sodium 139  Recent Lipid Panel No results found for: CHOL, TRIG, HDL, CHOLHDL, VLDL, LDLCALC, LDLDIRECT  Physical Exam:    VS:  BP 140/74 (BP Location: Left Arm, Patient Position: Sitting, Cuff Size: Normal)    Pulse 68    Ht 6' (1.829 m)    Wt 234 lb 3.2 oz (106.2 kg)    SpO2 98%    BMI 31.76 kg/m     Wt Readings from Last 3 Encounters:  10/24/19 234 lb 3.2 oz (106.2 kg)  10/10/19 235 lb 9.6 oz (106.9 kg)  10/08/19 234 lb (106.1 kg)     GEN: Well nourished, well developed in no acute distress HEENT: Normal NECK: No JVD; No carotid bruits LYMPHATICS: No lymphadenopathy CARDIAC: S1S2 noted,RRR, no murmurs, rubs, gallops RESPIRATORY:  Clear to auscultation without rales, wheezing or rhonchi  ABDOMEN: Soft, non-tender, non-distended, +bowel sounds, no guarding. EXTREMITIES: No edema, No cyanosis, no clubbing MUSCULOSKELETAL:  No deformity  SKIN: Warm and dry NEUROLOGIC:  Alert and oriented x 3, non-focal PSYCHIATRIC:  Normal affect, good insight  ASSESSMENT:    1. Essential hypertension   2. Mixed hyperlipidemia   3. Paroxysmal A-fib ([place])   4. Nonrheumatic mitral valve regurgitation   5. Nonrheumatic aortic valve insufficiency   6. Diabetic polyneuropathy associated with type 2 diabetes mellitus ([place])   7. Abnormal stress test    PLAN:     For now we will invest in aggressive medical therapy Aspirin 81 mg daily and Lipitor 40 mg daily. He wants to eventually get a LHC.  Blood pressure is acceptable no changes to his medication regimen. He is following with orthopedics for plans for his  compound fracture. Continue rate control agent- patient declines use of anticoagulation.   The patient is in agreement with the above plan. The patient left the office in stable condition.  The patient will follow up in 6 weeks.   Medication Adjustments/Labs and Tests Ordered: Current medicines are reviewed at length with the patient today.  Concerns regarding medicines are outlined above.  No orders of the defined types were placed in this encounter.  No orders of the defined types  were placed in this encounter.   Patient Instructions  Medication Instructions:  Your physician recommends that you continue on your current medications as directed. Please refer to the Current Medication list given to you today.  *If you need a refill on your cardiac medications before your next appointment, please call your pharmacy*   Lab Work: None If you have labs (blood work) drawn today and your tests are completely normal, you will receive your results only by:  [place] (if you have MyChart) OR  A paper copy in the mail If you have any lab test that is abnormal or we need to change your treatment, we will call you to review the results.   Testing/Procedures: None   Follow-Up: At [facility], you and your health needs are our priority.  As part of our continuing mission to provide you with exceptional heart care, we have created designated Provider Care Teams.  These Care Teams include your primary Cardiologist (physician) and Advanced Practice Providers (APPs -  Physician Assistants and Nurse Practitioners) who all work together to provide you with the care you need, when you need it.  We recommend signing up for the patient portal called "MyChart".  Sign up information is provided on this After Visit Summary.  MyChart is used to connect with patients for Virtual Visits (Telemedicine).  Patients are able to view lab/test results, encounter notes, upcoming appointments, etc.   Non-urgent messages can be sent to your provider as well.   To learn more about what you can do with MyChart, go to [email or web address].    Your next appointment:   6 week(s)  The format for your next appointment:   In Person  Provider:   Berniece Salines, DO   Other Instructions      Adopting a Healthy Lifestyle.  Know what a healthy weight is for you (roughly BMI <25) and aim to maintain this   Aim for 7+ servings of fruits and vegetables daily   65-80+ fluid ounces of water or unsweet tea for healthy kidneys   Limit to max 1 drink of alcohol per day; avoid smoking/tobacco   Limit animal fats in diet for cholesterol and heart health - choose grass fed whenever available   Avoid highly processed foods, and foods high in saturated/trans fats   Aim for low stress - take time to unwind and care for your mental health   Aim for 150 min of moderate intensity exercise weekly for heart health, and weights twice weekly for bone health   Aim for 7-9 hours of sleep daily   When it comes to diets, agreement about the perfect plan isnt easy to find, even among the experts. Experts at the [employer] developed an idea known as the Healthy Eating Plate. Just imagine a plate divided into logical, healthy portions.   The emphasis is on diet quality:   Load up on vegetables and fruits - one-half of your plate: Aim for color and variety, and remember that potatoes dont count.   Go for whole grains - one-quarter of your plate: Whole wheat, barley, wheat berries, quinoa, oats, brown rice, and foods made with them. If you want pasta, go with whole wheat pasta.   Protein power - one-quarter of your plate: Fish, chicken, beans, and nuts are all healthy, versatile protein sources. Limit red meat.   The diet, however, does go beyond the plate, offering a few other suggestions.   Use healthy plant oils, such as olive,  canola, soy, corn, sunflower and peanut. Check the  labels, and avoid partially hydrogenated oil, which have unhealthy trans fats.   If youre thirsty, drink water. Coffee and tea are good in moderation, but skip sugary drinks and limit milk and dairy products to one or two daily servings.   The type of carbohydrate in the diet is more important than the amount. Some sources of carbohydrates, such as vegetables, fruits, whole grains, and beans-are healthier than others.   Finally, stay active  Signed, Berniece Salines, DO  10/24/2019 9:24 PM    [place]

## 2019-11-03 DIAGNOSIS — E1169 Type 2 diabetes mellitus with other specified complication: Secondary | ICD-10-CM | POA: Diagnosis not present

## 2019-11-06 DIAGNOSIS — M47816 Spondylosis without myelopathy or radiculopathy, lumbar region: Secondary | ICD-10-CM | POA: Diagnosis not present

## 2019-11-07 DIAGNOSIS — Z6831 Body mass index (BMI) 31.0-31.9, adult: Secondary | ICD-10-CM | POA: Diagnosis not present

## 2019-11-07 DIAGNOSIS — I10 Essential (primary) hypertension: Secondary | ICD-10-CM | POA: Diagnosis not present

## 2019-11-07 DIAGNOSIS — M47816 Spondylosis without myelopathy or radiculopathy, lumbar region: Secondary | ICD-10-CM | POA: Diagnosis not present

## 2019-11-10 DIAGNOSIS — L814 Other melanin hyperpigmentation: Secondary | ICD-10-CM | POA: Diagnosis not present

## 2019-11-10 DIAGNOSIS — L309 Dermatitis, unspecified: Secondary | ICD-10-CM | POA: Diagnosis not present

## 2019-11-10 DIAGNOSIS — L219 Seborrheic dermatitis, unspecified: Secondary | ICD-10-CM | POA: Diagnosis not present

## 2019-12-02 DIAGNOSIS — F411 Generalized anxiety disorder: Secondary | ICD-10-CM | POA: Diagnosis not present

## 2019-12-02 DIAGNOSIS — M549 Dorsalgia, unspecified: Secondary | ICD-10-CM | POA: Diagnosis not present

## 2019-12-02 DIAGNOSIS — E1169 Type 2 diabetes mellitus with other specified complication: Secondary | ICD-10-CM | POA: Diagnosis not present

## 2019-12-02 DIAGNOSIS — I4821 Permanent atrial fibrillation: Secondary | ICD-10-CM | POA: Diagnosis not present

## 2019-12-02 DIAGNOSIS — I10 Essential (primary) hypertension: Secondary | ICD-10-CM | POA: Diagnosis not present

## 2019-12-04 DIAGNOSIS — S32020A Wedge compression fracture of second lumbar vertebra, initial encounter for closed fracture: Secondary | ICD-10-CM | POA: Diagnosis not present

## 2019-12-04 DIAGNOSIS — I10 Essential (primary) hypertension: Secondary | ICD-10-CM | POA: Diagnosis not present

## 2019-12-04 DIAGNOSIS — Z6831 Body mass index (BMI) 31.0-31.9, adult: Secondary | ICD-10-CM | POA: Diagnosis not present

## 2019-12-08 ENCOUNTER — Encounter: Payer: Self-pay | Admitting: Cardiology

## 2019-12-08 ENCOUNTER — Ambulatory Visit (INDEPENDENT_AMBULATORY_CARE_PROVIDER_SITE_OTHER): Payer: Medicare Other | Admitting: Cardiology

## 2019-12-08 ENCOUNTER — Other Ambulatory Visit: Payer: Self-pay

## 2019-12-08 VITALS — BP 142/88 | HR 90 | Ht 72.0 in | Wt 236.4 lb

## 2019-12-08 DIAGNOSIS — I4821 Permanent atrial fibrillation: Secondary | ICD-10-CM

## 2019-12-08 DIAGNOSIS — R9439 Abnormal result of other cardiovascular function study: Secondary | ICD-10-CM

## 2019-12-08 DIAGNOSIS — I10 Essential (primary) hypertension: Secondary | ICD-10-CM | POA: Diagnosis not present

## 2019-12-08 DIAGNOSIS — E1142 Type 2 diabetes mellitus with diabetic polyneuropathy: Secondary | ICD-10-CM | POA: Diagnosis not present

## 2019-12-08 DIAGNOSIS — E782 Mixed hyperlipidemia: Secondary | ICD-10-CM | POA: Diagnosis not present

## 2019-12-08 NOTE — Progress Notes (Signed)
Cardiology Office Note:    Date:  12/08/2019   ID:  Keith Mccall, DOB [birth year], MRN [id number]  PCP:  Keith Kiel, MD  Cardiologist:  Keith Salines, DO  Electrophysiologist:  None   Referring MD: Keith Kiel, MD   " I feel ok my back is improving but PT is planned"  History of Present Illness:    Keith Mccall is a 86 y.o. male with a hx of permanent atrial fibrillation on metoprolol however patient reports that he had declined anticoagulation and prefers not to take any anticoagulation, diabetes type 2, hypertension, hyperlipidemia.  I did see the the patient on 10/10/2019 at that time we discussed his stress test results with plans for possible left heart cath. But the patient had to defer the procedure due to significant back pain and his inability to be able to lay flat for the procedure.   I did see the patient October 24, 2019 at that time he wanted to work out his orthopedic with his orthopedic surgeon prior to considering left heart catheterization.  He has been working working with his Doctor, general practice and is planning physical therapy now coming weeks.  He still is not ready for pursuing any left heart catheterization.  He also has not been taking his aspirin 81 mg daily as well as a statin.   Past Medical History:  Diagnosis Date  . Anxiety   . Atrial fibrillation ([place])   . Chest pain in adult 12/07/2017  . Comprehensive diabetic foot examination, type 2 DM, encounter for ([place]) 04/10/2019  . Compression fracture of C-spine ([place])   . Contusion of right great toe without damage to nail 04/10/2019  . Diabetic polyneuropathy associated with type 2 diabetes mellitus ([place]) 04/10/2019  . Essential hypertension 12/07/2017  . Hallux hammertoe, right 04/10/2019  . Mixed hyperlipidemia 12/07/2017  . Nonrheumatic aortic valve insufficiency 12/07/2017  . Nonrheumatic mitral valve regurgitation 12/07/2017  . Paroxysmal A-fib ([place]) 12/05/2017  . PVC (premature  ventricular contraction) 12/05/2017  . Sick sinus syndrome ([place]) 12/07/2017  . Spinal stenosis     Past Surgical History:  Procedure Laterality Date  . HERNIA REPAIR      Current Medications: Current Meds  Medication Sig  . ACCU-CHEK AVIVA PLUS test strip USE TO CHECK BLOOD SUGAR TWICE A DAY  . acetaminophen (TYLENOL) 500 MG tablet Take 1,000 mg by mouth every 6 (six) hours as needed for mild pain. Rapid release  . ALPRAZolam (XANAX) 1 MG tablet Take 1 mg by mouth 2 (two) times daily as needed for anxiety.   Marland Kitchen co-enzyme Q-10 30 MG capsule Take 30 mg by mouth 3 (three) times daily.  . DHA-EPA-Vit B6-B12-Folic Acid (CARDIOVID PLUS) CAPS Take 2 capsules by mouth 3 (three) times daily with meals.  Kendall Flack 575 MG/5ML SYRP Take 5 mLs by mouth daily. Extract  . furosemide (LASIX) 20 MG tablet Take 20 mg Lasix on Tuesday and Saturday. (Patient taking differently: Take 20 mg by mouth See admin instructions. Take 20 mg Tuesday and Saturday.)  . Hawthorne Berry 500 MG CAPS Take 500 mg by mouth daily.   . metoprolol tartrate (LOPRESSOR) 25 MG tablet Take 12.5 mg by mouth 2 (two) times daily.    . nitroGLYCERIN (NITROSTAT) 0.4 MG SL tablet Place 0.4 mg under the tongue every 5 (five) minutes as needed for chest pain.   Marland Kitchen OVER THE COUNTER MEDICATION Take 2 tablets by mouth 3 (three) times daily with meals. Diaplex  . OVER THE COUNTER  MEDICATION Take 1 tablet by mouth 2 (two) times daily with a meal. pancreatrophin pmg  . OVER THE COUNTER MEDICATION Take 1 tablet by mouth 3 (three) times daily with meals. livaplex  . OVER THE COUNTER MEDICATION Take 1-5 tablets by mouth See admin instructions. cataplex e2  Daily and need for tightness in chest or shortness of breath can chew up to 5 tables  . Probiotic Product (PROBIOTIC PO) Take 1 tablet by mouth daily. 60 Billion  . Taurine 1000 MG CAPS Take 1,000 mg by mouth daily.   Marland Kitchen Ubiquinol 50 MG CAPS Take 50 mg by mouth daily.      Allergies:    Amiodarone, Erythromycin base, Polycarbophil, Penicillins, and Sulfa antibiotics   Social History   Socioeconomic History  . Marital status: Married    Spouse name: Not on file  . Number of children: 3  . Years of education: Not on file  . Highest education level: Not on file  Occupational History  . Occupation: retired  Tobacco Use  . Smoking status: Never Smoker  . Smokeless tobacco: Never Used  Substance and Sexual Activity  . Alcohol use: No  . Drug use: No  . Sexual activity: Not on file  Other Topics Concern  . Not on file  Social History Narrative  . Not on file   Social Determinants of Health   Financial Resource Strain:   . Difficulty of Paying Living Expenses: Not on file  Food Insecurity:   . Worried About Charity fundraiser in the Last Year: Not on file  . Ran Out of Food in the Last Year: Not on file  Transportation Needs:   . Lack of Transportation (Medical): Not on file  . Lack of Transportation (Non-Medical): Not on file  Physical Activity:   . Days of Exercise per Week: Not on file  . Minutes of Exercise per Session: Not on file  Stress:   . Feeling of Stress : Not on file  Social Connections:   . Frequency of Communication with Friends and Family: Not on file  . Frequency of Social Gatherings with Friends and Family: Not on file  . Attends Religious Services: Not on file  . Active Member of Clubs or Organizations: Not on file  . Attends Archivist Meetings: Not on file  . Marital Status: Not on file     Family History: The patient's family history includes Breast cancer in his sister; CAD in his father; Coronary artery disease in his father; Diabetes in his maternal grandmother, mother, and sister; GI Bleed in his sister; Heart attack in his mother; Heart disease in his mother; Hyperlipidemia in his sister; Hypertension in his father; Lung cancer in his mother; Migraines in his child; Stroke in his father.  ROS:   Review of Systems    Constitution: Negative for decreased appetite, fever and weight gain.  HENT: Negative for congestion, ear discharge, hoarse voice and sore throat.   Eyes: Negative for discharge, redness, vision loss in right eye and visual halos.  Cardiovascular: Negative for chest pain, dyspnea on exertion, leg swelling, orthopnea and palpitations.  Respiratory: Negative for cough, hemoptysis, shortness of breath and snoring.   Endocrine: Negative for heat intolerance and polyphagia.  Hematologic/Lymphatic: Negative for bleeding problem. Does not bruise/bleed easily.  Skin: Negative for flushing, nail changes, rash and suspicious lesions.  Musculoskeletal: Negative for arthritis, joint pain, muscle cramps, myalgias, neck pain and stiffness.  Gastrointestinal: Negative for abdominal pain, bowel incontinence, diarrhea and  excessive appetite.  Genitourinary: Negative for decreased libido, genital sores and incomplete emptying.  Neurological: Negative for brief paralysis, focal weakness, headaches and loss of balance.  Psychiatric/Behavioral: Negative for altered mental status, depression and suicidal ideas.  Allergic/Immunologic: Negative for HIV exposure and persistent infections.    EKGs/Labs/Other Studies Reviewed:    The following studies were reviewed today:   EKG: None today Recent Labs: 10/06/2019: BNP 102.9; Magnesium 2.1 10/10/2019: BUN 17; Creatinine, Ser 0.76; Hemoglobin 13.5; Platelets 175; Potassium 4.2; Sodium 139  Recent Lipid Panel No results found for: CHOL, TRIG, HDL, CHOLHDL, VLDL, LDLCALC, LDLDIRECT  Physical Exam:    VS:  BP (!) 142/88   Pulse 90   Ht 6' (1.829 m)   Wt 236 lb 6.4 oz (107.2 kg)   SpO2 93%   BMI 32.06 kg/m     Wt Readings from Last 3 Encounters:  12/08/19 236 lb 6.4 oz (107.2 kg)  10/24/19 234 lb 3.2 oz (106.2 kg)  10/10/19 235 lb 9.6 oz (106.9 kg)     GEN: Well nourished, well developed in no acute distress HEENT: Normal NECK: No JVD; No carotid  bruits LYMPHATICS: No lymphadenopathy CARDIAC: S1S2 noted,RRR, no murmurs, rubs, gallops RESPIRATORY:  Clear to auscultation without rales, wheezing or rhonchi  ABDOMEN: Soft, non-tender, non-distended, +bowel sounds, no guarding. EXTREMITIES: No edema, No cyanosis, no clubbing MUSCULOSKELETAL:  No deformity  SKIN: Warm and dry NEUROLOGIC:  Alert and oriented x 3, non-focal PSYCHIATRIC:  Normal affect, good insight  ASSESSMENT:    1. Abnormal stress test   2. Essential hypertension   3. Permanent atrial fibrillation ([place])   4. Diabetic polyneuropathy associated with type 2 diabetes mellitus ([place])   5. Mixed hyperlipidemia    PLAN:     We had a very long discussion about plan moving forward.  He prefers not to take aspirin and statin for now.  He will continue with metoprolol he tells me.  He is working with his orthopedic surgeon on his low back pain and recent compound fracture.  He notes that he thinks that the chest pain he has is related to his anxiety that is why he would prefer not to really push on getting a left heart catheterization at this point.  Also explained to him that the test being abnormal as the only way we can be able to verify.  But for now he can respect the patient's wishes and continue to follow him.  He prefers not to take aspirin or statin at this time.  The patient is in agreement with the above plan. The patient left the office in stable condition.  The patient will follow up in 6 months or sooner if needed.   Medication Adjustments/Labs and Tests Ordered: Current medicines are reviewed at length with the patient today.  Concerns regarding medicines are outlined above.  No orders of the defined types were placed in this encounter.  No orders of the defined types were placed in this encounter.   Patient Instructions  Medication Instructions:  Your physician recommends that you continue on your current medications as directed. Please refer to the  Current Medication list given to you today.  *If you need a refill on your cardiac medications before your next appointment, please call your pharmacy*   Lab Work: none If you have labs (blood work) drawn today and your tests are completely normal, you will receive your results only by: Marland Kitchen MyChart Message (if you have MyChart) OR . A paper copy in  the mail If you have any lab test that is abnormal or we need to change your treatment, we will call you to review the results.   Testing/Procedures: none   Follow-Up: At [facility], you and your health needs are our priority.  As part of our continuing mission to provide you with exceptional heart care, we have created designated Provider Care Teams.  These Care Teams include your primary Cardiologist (physician) and Advanced Practice Providers (APPs -  Physician Assistants and Nurse Practitioners) who all work together to provide you with the care you need, when you need it.  We recommend signing up for the patient portal called "MyChart".  Sign up information is provided on this After Visit Summary.  MyChart is used to connect with patients for Virtual Visits (Telemedicine).  Patients are able to view lab/test results, encounter notes, upcoming appointments, etc.  Non-urgent messages can be sent to your provider as well.   To learn more about what you can do with MyChart, go to [email or web address].    Your next appointment:   6 month(s)  The format for your next appointment:   In Person  Provider:   Berniece Salines, DO   Other Instructions      Adopting a Healthy Lifestyle.  Know what a healthy weight is for you (roughly BMI <25) and aim to maintain this   Aim for 7+ servings of fruits and vegetables daily   65-80+ fluid ounces of water or unsweet tea for healthy kidneys   Limit to max 1 drink of alcohol per day; avoid smoking/tobacco   Limit animal fats in diet for cholesterol and heart health - choose grass fed  whenever available   Avoid highly processed foods, and foods high in saturated/trans fats   Aim for low stress - take time to unwind and care for your mental health   Aim for 150 min of moderate intensity exercise weekly for heart health, and weights twice weekly for bone health   Aim for 7-9 hours of sleep daily   When it comes to diets, agreement about the perfect plan isnt easy to find, even among the experts. Experts at the [employer] developed an idea known as the Healthy Eating Plate. Just imagine a plate divided into logical, healthy portions.   The emphasis is on diet quality:   Load up on vegetables and fruits - one-half of your plate: Aim for color and variety, and remember that potatoes dont count.   Go for whole grains - one-quarter of your plate: Whole wheat, barley, wheat berries, quinoa, oats, brown rice, and foods made with them. If you want pasta, go with whole wheat pasta.   Protein power - one-quarter of your plate: Fish, chicken, beans, and nuts are all healthy, versatile protein sources. Limit red meat.   The diet, however, does go beyond the plate, offering a few other suggestions.   Use healthy plant oils, such as olive, canola, soy, corn, sunflower and peanut. Check the labels, and avoid partially hydrogenated oil, which have unhealthy trans fats.   If youre thirsty, drink water. Coffee and tea are good in moderation, but skip sugary drinks and limit milk and dairy products to one or two daily servings.   The type of carbohydrate in the diet is more important than the amount. Some sources of carbohydrates, such as vegetables, fruits, whole grains, and beans-are healthier than others.   Finally, stay active  Signed, Keith Salines, DO  12/08/2019 2:45 PM  [facility]

## 2019-12-08 NOTE — Patient Instructions (Signed)
Medication Instructions:  Your physician recommends that you continue on your current medications as directed. Please refer to the Current Medication list given to you today.  *If you need a refill on your cardiac medications before your next appointment, please call your pharmacy*   Lab Work: none If you have labs (blood work) drawn today and your tests are completely normal, you will receive your results only by: Marland Kitchen MyChart Message (if you have MyChart) OR . A paper copy in the mail If you have any lab test that is abnormal or we need to change your treatment, we will call you to review the results.   Testing/Procedures: none   Follow-Up: At [facility], you and your health needs are our priority.  As part of our continuing mission to provide you with exceptional heart care, we have created designated Provider Care Teams.  These Care Teams include your primary Cardiologist (physician) and Advanced Practice Providers (APPs -  Physician Assistants and Nurse Practitioners) who all work together to provide you with the care you need, when you need it.  We recommend signing up for the patient portal called "MyChart".  Sign up information is provided on this After Visit Summary.  MyChart is used to connect with patients for Virtual Visits (Telemedicine).  Patients are able to view lab/test results, encounter notes, upcoming appointments, etc.  Non-urgent messages can be sent to your provider as well.   To learn more about what you can do with MyChart, go to [email or web address].    Your next appointment:   6 month(s)  The format for your next appointment:   In Person  Provider:   Berniece Salines, DO   Other Instructions

## 2019-12-10 DIAGNOSIS — M545 Low back pain, unspecified: Secondary | ICD-10-CM | POA: Diagnosis not present

## 2019-12-10 DIAGNOSIS — R293 Abnormal posture: Secondary | ICD-10-CM | POA: Diagnosis not present

## 2019-12-10 DIAGNOSIS — M4046 Postural lordosis, lumbar region: Secondary | ICD-10-CM | POA: Diagnosis not present

## 2019-12-17 DIAGNOSIS — R293 Abnormal posture: Secondary | ICD-10-CM | POA: Diagnosis not present

## 2019-12-17 DIAGNOSIS — M4046 Postural lordosis, lumbar region: Secondary | ICD-10-CM | POA: Diagnosis not present

## 2019-12-17 DIAGNOSIS — M545 Low back pain, unspecified: Secondary | ICD-10-CM | POA: Diagnosis not present

## 2019-12-24 DIAGNOSIS — M545 Low back pain, unspecified: Secondary | ICD-10-CM | POA: Diagnosis not present

## 2019-12-24 DIAGNOSIS — R293 Abnormal posture: Secondary | ICD-10-CM | POA: Diagnosis not present

## 2019-12-24 DIAGNOSIS — M4046 Postural lordosis, lumbar region: Secondary | ICD-10-CM | POA: Diagnosis not present

## 2019-12-29 DIAGNOSIS — R293 Abnormal posture: Secondary | ICD-10-CM | POA: Diagnosis not present

## 2019-12-29 DIAGNOSIS — M545 Low back pain, unspecified: Secondary | ICD-10-CM | POA: Diagnosis not present

## 2019-12-29 DIAGNOSIS — M4046 Postural lordosis, lumbar region: Secondary | ICD-10-CM | POA: Diagnosis not present

## 2019-12-30 ENCOUNTER — Ambulatory Visit: Payer: Medicare Other | Admitting: Cardiology

## 2020-01-21 DIAGNOSIS — M47816 Spondylosis without myelopathy or radiculopathy, lumbar region: Secondary | ICD-10-CM | POA: Diagnosis not present

## 2020-01-21 DIAGNOSIS — R682 Dry mouth, unspecified: Secondary | ICD-10-CM | POA: Diagnosis not present

## 2020-01-21 DIAGNOSIS — E1169 Type 2 diabetes mellitus with other specified complication: Secondary | ICD-10-CM | POA: Diagnosis not present

## 2020-01-21 DIAGNOSIS — I10 Essential (primary) hypertension: Secondary | ICD-10-CM | POA: Diagnosis not present

## 2020-01-21 DIAGNOSIS — F411 Generalized anxiety disorder: Secondary | ICD-10-CM | POA: Diagnosis not present

## 2020-01-21 DIAGNOSIS — Z1331 Encounter for screening for depression: Secondary | ICD-10-CM | POA: Diagnosis not present

## 2020-01-21 DIAGNOSIS — Z6833 Body mass index (BMI) 33.0-33.9, adult: Secondary | ICD-10-CM | POA: Diagnosis not present

## 2020-01-21 DIAGNOSIS — Z1339 Encounter for screening examination for other mental health and behavioral disorders: Secondary | ICD-10-CM | POA: Diagnosis not present

## 2020-01-21 DIAGNOSIS — I4821 Permanent atrial fibrillation: Secondary | ICD-10-CM | POA: Diagnosis not present

## 2020-01-21 DIAGNOSIS — Z0000 Encounter for general adult medical examination without abnormal findings: Secondary | ICD-10-CM | POA: Diagnosis not present

## 2020-02-05 DIAGNOSIS — L600 Ingrowing nail: Secondary | ICD-10-CM | POA: Insufficient documentation

## 2020-02-05 DIAGNOSIS — E119 Type 2 diabetes mellitus without complications: Secondary | ICD-10-CM | POA: Diagnosis not present

## 2020-02-05 DIAGNOSIS — M79674 Pain in right toe(s): Secondary | ICD-10-CM | POA: Insufficient documentation

## 2020-02-12 DIAGNOSIS — I4821 Permanent atrial fibrillation: Secondary | ICD-10-CM | POA: Diagnosis not present

## 2020-02-12 DIAGNOSIS — Z79899 Other long term (current) drug therapy: Secondary | ICD-10-CM | POA: Diagnosis not present

## 2020-02-12 DIAGNOSIS — R682 Dry mouth, unspecified: Secondary | ICD-10-CM | POA: Diagnosis not present

## 2020-02-12 DIAGNOSIS — I10 Essential (primary) hypertension: Secondary | ICD-10-CM | POA: Diagnosis not present

## 2020-02-12 DIAGNOSIS — E1169 Type 2 diabetes mellitus with other specified complication: Secondary | ICD-10-CM | POA: Diagnosis not present

## 2020-02-19 DIAGNOSIS — L600 Ingrowing nail: Secondary | ICD-10-CM | POA: Diagnosis not present

## 2020-02-20 DIAGNOSIS — R2689 Other abnormalities of gait and mobility: Secondary | ICD-10-CM | POA: Diagnosis not present

## 2020-02-20 DIAGNOSIS — M6281 Muscle weakness (generalized): Secondary | ICD-10-CM | POA: Diagnosis not present

## 2020-02-27 DIAGNOSIS — M6281 Muscle weakness (generalized): Secondary | ICD-10-CM | POA: Diagnosis not present

## 2020-02-27 DIAGNOSIS — R2689 Other abnormalities of gait and mobility: Secondary | ICD-10-CM | POA: Diagnosis not present

## 2020-03-04 DIAGNOSIS — I10 Essential (primary) hypertension: Secondary | ICD-10-CM | POA: Diagnosis not present

## 2020-03-04 DIAGNOSIS — S32020A Wedge compression fracture of second lumbar vertebra, initial encounter for closed fracture: Secondary | ICD-10-CM | POA: Diagnosis not present

## 2020-03-04 DIAGNOSIS — Z6831 Body mass index (BMI) 31.0-31.9, adult: Secondary | ICD-10-CM | POA: Diagnosis not present

## 2020-03-05 DIAGNOSIS — R2689 Other abnormalities of gait and mobility: Secondary | ICD-10-CM | POA: Diagnosis not present

## 2020-03-05 DIAGNOSIS — M6281 Muscle weakness (generalized): Secondary | ICD-10-CM | POA: Diagnosis not present

## 2020-03-09 ENCOUNTER — Ambulatory Visit (INDEPENDENT_AMBULATORY_CARE_PROVIDER_SITE_OTHER): Payer: Medicare Other | Admitting: Cardiology

## 2020-03-09 ENCOUNTER — Other Ambulatory Visit: Payer: Self-pay

## 2020-03-09 ENCOUNTER — Encounter: Payer: Self-pay | Admitting: Cardiology

## 2020-03-09 VITALS — BP 164/84 | HR 83 | Ht 72.0 in | Wt 233.4 lb

## 2020-03-09 DIAGNOSIS — I4821 Permanent atrial fibrillation: Secondary | ICD-10-CM

## 2020-03-09 NOTE — Patient Instructions (Signed)
Medication Instructions:  Your physician recommends that you continue on your current medications as directed. Please refer to the Current Medication list given to you today.  *If you need a refill on your cardiac medications before your next appointment, please call your pharmacy*   Lab Work: None ordered   Testing/Procedures: None ordered   Follow-Up: At [facility], you and your health needs are our priority.  As part of our continuing mission to provide you with exceptional heart care, we have created designated Provider Care Teams.  These Care Teams include your primary Cardiologist (physician) and Advanced Practice Providers (APPs -  Physician Assistants and Nurse Practitioners) who all work together to provide you with the care you need, when you need it.    Your next appointment:   1 year(s)  The format for your next appointment:   In Person  Provider:   Allegra Lai, MD    Thank you for choosing [facility]!!   Trinidad Curet, RN [phone]   Other Instructions  Apixaban Tablets What is this medicine? APIXABAN (a PIX a ban) is an anticoagulant (blood thinner). It is used to lower the chance of stroke in people with a medical condition called atrial fibrillation. It is also used to treat or prevent blood clots in the lungs or in the veins. This medicine may be used for other purposes; ask your health care provider or pharmacist if you have questions. COMMON BRAND NAME(S): Eliquis What should I tell my health care provider before I take this medicine? They need to know if you have any of these conditions:  antiphospholipid antibody syndrome  bleeding disorders  bleeding in the brain  blood in your stools (black or tarry stools) or if you have blood in your vomit  history of blood clots  history of stomach bleeding  kidney disease  liver disease  mechanical heart valve  an unusual or allergic reaction to apixaban, other medicines, foods,  dyes, or preservatives  pregnant or trying to get pregnant  breast-feeding How should I use this medicine? Take this medicine by mouth with a glass of water. Follow the directions on the prescription label. You can take it with or without food. If it upsets your stomach, take it with food. Take your medicine at regular intervals. Do not take it more often than directed. Do not stop taking except on your doctor's advice. Stopping this medicine may increase your risk of a blood clot. Be sure to refill your prescription before you run out of medicine. Talk to your pediatrician regarding the use of this medicine in children. Special care may be needed. Overdosage: If you think you have taken too much of this medicine contact a poison control center or emergency room at once. NOTE: This medicine is only for you. Do not share this medicine with others. What if I miss a dose? If you miss a dose, take it as soon as you can. If it is almost time for your next dose, take only that dose. Do not take double or extra doses. What may interact with this medicine? This medicine may interact with the following:  aspirin and aspirin-like medicines  certain medicines for fungal infections like ketoconazole and itraconazole  certain medicines for seizures like carbamazepine and phenytoin  certain medicines that treat or prevent blood clots like warfarin, enoxaparin, and dalteparin  clarithromycin  NSAIDs, medicines for pain and inflammation, like ibuprofen or naproxen  rifampin  ritonavir  St. Mitesh's wort This list may not  describe all possible interactions. Give your health care provider a list of all the medicines, herbs, non-prescription drugs, or dietary supplements you use. Also tell them if you smoke, drink alcohol, or use illegal drugs. Some items may interact with your medicine. What should I watch for while using this medicine? Visit your healthcare professional for regular checks on your  progress. You may need blood work done while you are taking this medicine. Your condition will be monitored carefully while you are receiving this medicine. It is important not to miss any appointments. Avoid sports and activities that might cause injury while you are using this medicine. Severe falls or injuries can cause unseen bleeding. Be careful when using sharp tools or knives. Consider using an Copy. Take special care brushing or flossing your teeth. Report any injuries, bruising, or red spots on the skin to your healthcare professional. If you are going to need surgery or other procedure, tell your healthcare professional that you are taking this medicine. Wear a medical ID bracelet or chain. Carry a card that describes your disease and details of your medicine and dosage times. What side effects may I notice from receiving this medicine? Side effects that you should report to your doctor or health care professional as soon as possible:  allergic reactions like skin rash, itching or hives, swelling of the face, lips, or tongue  signs and symptoms of bleeding such as bloody or black, tarry stools; red or dark-brown urine; spitting up blood or brown material that looks like coffee grounds; red spots on the skin; unusual bruising or bleeding from the eye, gums, or nose  signs and symptoms of a blood clot such as chest pain; shortness of breath; pain, swelling, or warmth in the leg  signs and symptoms of a stroke such as changes in vision; confusion; trouble speaking or understanding; severe headaches; sudden numbness or weakness of the face, arm or leg; trouble walking; dizziness; loss of coordination This list may not describe all possible side effects. Call your doctor for medical advice about side effects. You may report side effects to FDA at 1-800-FDA-1088. Where should I keep my medicine? Keep out of the reach of children. Store at room temperature between 20 and 25 degrees C  (68 and 77 degrees F). Throw away any unused medicine after the expiration date. NOTE: This sheet is a summary. It may not cover all possible information. If you have questions about this medicine, talk to your doctor, pharmacist, or health care provider.  2021 Elsevier/Gold Standard (2019-11-12 16:54:11)

## 2020-03-09 NOTE — Progress Notes (Signed)
Electrophysiology Office Note   Date:  03/09/2020   ID:  Keith Mccall, Keith Mccall 15-Aug-1930, MRN [id number]  PCP:  Keith Kiel, MD  Cardiologist:  Keith Mccall Primary Electrophysiologist:  Keith Livecchi Meredith Leeds, MD    No chief complaint on file.    History of Present Illness: Keith Mccall is a 87 y.o. male who is being seen today for the evaluation of atrial fibrillation at the request of Keith Kiel, MD. Presenting today for electrophysiology evaluation.    He has a history significant for hypertension, diabetes, atrial fibrillation, aortic sclerosis, and mitral regurgitation.  He has been in atrial fibrillation since 2005.  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  He is unaware of his atrial fibrillation.  He takes Eliquis for anticoagulation.  Today, denies symptoms of palpitations, chest pain, shortness of breath, orthopnea, PND, lower extremity edema, claudication, dizziness, presyncope, syncope, bleeding, or neurologic sequela. The patient is tolerating medications without difficulties.  He is very nervous about his cardiac condition.  He was told that he had an intermediate risk stress test and needs a left heart catheterization.  Fortunately he is not having any chest pain or shortness of breath.  Is able to do all of his daily activities and is only limited by back pain.  He is unaware of his atrial fibrillation.   Past Medical History:  Diagnosis Date  . Anxiety   . Atrial fibrillation ([place])   . Chest pain in adult 12/07/2017  . Comprehensive diabetic foot examination, type 2 DM, encounter for ([place]) 04/10/2019  . Compression fracture of C-spine ([place])   . Contusion of right great toe without damage to nail 04/10/2019  . Diabetic polyneuropathy associated with type 2 diabetes mellitus ([place]) 04/10/2019  . Essential hypertension 12/07/2017  . Hallux hammertoe, right 04/10/2019  . Mixed hyperlipidemia 12/07/2017  . Nonrheumatic aortic valve insufficiency  12/07/2017  . Nonrheumatic mitral valve regurgitation 12/07/2017  . Paroxysmal A-fib ([place]) 12/05/2017  . PVC (premature ventricular contraction) 12/05/2017  . Sick sinus syndrome ([place]) 12/07/2017  . Spinal stenosis    Past Surgical History:  Procedure Laterality Date  . HERNIA REPAIR       Current Outpatient Medications  Medication Sig Dispense Refill  . ACCU-CHEK AVIVA PLUS test strip USE TO CHECK BLOOD SUGAR TWICE A DAY  7  . acetaminophen (TYLENOL) 500 MG tablet Take 1,000 mg by mouth every 6 (six) hours as needed for mild pain. Rapid release    . ALPRAZolam (XANAX) 1 MG tablet Take 1 mg by mouth 2 (two) times daily as needed for anxiety.    Marland Kitchen co-enzyme Q-10 30 MG capsule Take 30 mg by mouth 3 (three) times daily.    . DHA-EPA-Vit B6-B12-Folic Acid (CARDIOVID PLUS) CAPS Take 2 capsules by mouth 3 (three) times daily with meals.    Kendall Flack 575 MG/5ML SYRP Take 5 mLs by mouth daily. Extract    . furosemide (LASIX) 20 MG tablet Take 20 mg Lasix on Tuesday and Saturday. 36 tablet 3  . Hawthorne Berry 500 MG CAPS Take 500 mg by mouth daily.    Marland Kitchen HYDROcodone-acetaminophen (NORCO/VICODIN) 5-325 MG tablet Take 1 tablet by mouth every 6 (six) hours as needed.    . metoprolol tartrate (LOPRESSOR) 25 MG tablet Take 12.5 mg by mouth 2 (two) times daily.    . nitroGLYCERIN (NITROSTAT) 0.4 MG SL tablet Place 0.4 mg under the tongue every 5 (five) minutes as needed for chest pain.     Marland Kitchen  OVER THE COUNTER MEDICATION Take 2 tablets by mouth 3 (three) times daily with meals. Diaplex    . OVER THE COUNTER MEDICATION Take 1 tablet by mouth 2 (two) times daily with a meal. pancreatrophin pmg    . OVER THE COUNTER MEDICATION Take 1 tablet by mouth 3 (three) times daily with meals. livaplex    . OVER THE COUNTER MEDICATION Take 1-5 tablets by mouth See admin instructions. cataplex e2  Daily and need for tightness in chest or shortness of breath can chew up to 5 tables    . Probiotic Product  (PROBIOTIC PO) Take 1 tablet by mouth daily. 36 Billion    . Taurine 1000 MG CAPS Take 1,000 mg by mouth daily.    Marland Kitchen Ubiquinol 50 MG CAPS Take 50 mg by mouth daily.     No current facility-administered medications for this visit.    Allergies:   Amiodarone, Erythromycin base, Polycarbophil, Penicillins, and Sulfa antibiotics   Social History:  The patient  reports that he has never smoked. He has never used smokeless tobacco. He reports that he does not drink alcohol and does not use drugs.   Family History:  The patient's family history includes Breast cancer in his sister; CAD in his father; Coronary artery disease in his father; Diabetes in his maternal grandmother, mother, and sister; GI Bleed in his sister; Heart attack in his mother; Heart disease in his mother; Hyperlipidemia in his sister; Hypertension in his father; Lung cancer in his mother; Migraines in his child; Stroke in his father.   ROS:  Please see the history of present illness.   Otherwise, review of systems is positive for none.   All other systems are reviewed and negative.   PHYSICAL EXAM: VS:  BP (!) 164/84   Pulse 83   Ht 6' (1.829 m)   Wt 233 lb 7.2 oz (105.9 kg)   SpO2 99%   BMI 31.66 kg/m  , BMI Body mass index is 31.66 kg/m. GEN: Well nourished, well developed, in no acute distress  HEENT: normal  Neck: no JVD, carotid bruits, or masses Cardiac: Irregular; no murmurs, rubs, or gallops,no edema  Respiratory:  clear to auscultation bilaterally, normal work of breathing GI: soft, nontender, nondistended, + BS MS: no deformity or atrophy  Skin: warm and dry Neuro:  Strength and sensation are intact Psych: euthymic mood, full affect  EKG:  EKG is ordered today. Personal review of the ekg ordered shows atrial fibrillation, 83  Recent Labs: 10/06/2019: BNP 102.9; Magnesium 2.1 10/10/2019: BUN 17; Creatinine, Ser 0.76; Hemoglobin 13.5; Platelets 175; Potassium 4.2; Sodium 139    Lipid Panel  No results  found for: CHOL, TRIG, HDL, CHOLHDL, VLDL, LDLCALC, LDLDIRECT   Wt Readings from Last 3 Encounters:  03/09/20 233 lb 7.2 oz (105.9 kg)  12/08/19 236 lb 6.4 oz (107.2 kg)  10/24/19 234 lb 3.2 oz (106.2 kg)      Other studies Reviewed: Additional studies/ records that were reviewed today include: TTE 12/20/17  Review of the above records today demonstrates:  Moderate mitral annular calcification.  Moderate mitral regurgitation.  There is moderate fibrocalcific sclerosis of the aortic valve with evidence of  mild aortic stenosis with mean gradient across the aortic valve of 10 mmHg and  calculated aortic valve area was 1.26 square centimeters, all suggesting mild  aortic stenosis.  Tricuspid valve is structurally normal.  Mild tricuspid regurgitation.  RVSP 30 mm Hg.  Ejection fraction is visually estimated at 55%  Mild concentric left ventricular hypertrophy  The aortic root diameter is within normal limits.  The IVC is dilated with collapse during inspiration.   Myoview 10/08/2019.  There was no ST segment deviation noted during stress.  No T wave inversion was noted during stress.  Defect 1: There is a medium defect of mild severity present in the basal inferoseptal location.  Findings consistent with prior myocardial infarction with peri-infarct ischemia.  This is an intermediate risk study.  Non-gated gated study therefore can not determine wall motion abnormalities. With a dilated left ventricular cavity and abnormal TID the study is a intermediate risk. Clinical correlate as patient may need further testing with left heart catheterization.    ASSESSMENT AND PLAN:  1.  Permanent atrial fibrillation: Currently well rate controlled on metoprolol.  CHA2DS2-VASc of 6.  He is currently not anticoagulated.  He states that he has quite a few medication side effects.  Despite that, he would benefit from anticoagulation for stroke prevention.  We discussed about the  possibility of Eliquis.  He Lanetra Hartley consider this and call us back if he wishes to start it.  2.  Hypertension: Mildly elevated today.  Usually better controlled.  Plan per primary cardiology.  3.  Intermediate risk stress test: Patient is waiting for his back issues to be worked out prior to left heart catheterization.  Case discussed with primary cardiology.  Current medicines are reviewed at length with the patient today.   The patient does not have concerns regarding his medicines.  The following changes were made today: None  Labs/ tests ordered today include:  Orders Placed This Encounter  Procedures  . EKG 12-Lead     Disposition:   FU with Denario Bagot 12 months  Signed, Keith Mccall Meredith Leeds, MD  03/09/2020 1:45 PM     [address] [place] [id number] [phone] (office) [phone] (fax)

## 2020-03-12 DIAGNOSIS — M6281 Muscle weakness (generalized): Secondary | ICD-10-CM | POA: Diagnosis not present

## 2020-03-12 DIAGNOSIS — R2689 Other abnormalities of gait and mobility: Secondary | ICD-10-CM | POA: Diagnosis not present

## 2020-03-16 DIAGNOSIS — R2689 Other abnormalities of gait and mobility: Secondary | ICD-10-CM | POA: Diagnosis not present

## 2020-03-16 DIAGNOSIS — M6281 Muscle weakness (generalized): Secondary | ICD-10-CM | POA: Diagnosis not present

## 2020-03-23 DIAGNOSIS — M6281 Muscle weakness (generalized): Secondary | ICD-10-CM | POA: Diagnosis not present

## 2020-03-23 DIAGNOSIS — R2689 Other abnormalities of gait and mobility: Secondary | ICD-10-CM | POA: Diagnosis not present

## 2020-03-29 DIAGNOSIS — R2689 Other abnormalities of gait and mobility: Secondary | ICD-10-CM | POA: Diagnosis not present

## 2020-03-29 DIAGNOSIS — M6281 Muscle weakness (generalized): Secondary | ICD-10-CM | POA: Diagnosis not present

## 2020-04-06 ENCOUNTER — Ambulatory Visit: Payer: Medicare Other | Admitting: Cardiology

## 2020-04-06 DIAGNOSIS — R2689 Other abnormalities of gait and mobility: Secondary | ICD-10-CM | POA: Diagnosis not present

## 2020-04-06 DIAGNOSIS — M6281 Muscle weakness (generalized): Secondary | ICD-10-CM | POA: Diagnosis not present

## 2020-04-13 DIAGNOSIS — R2689 Other abnormalities of gait and mobility: Secondary | ICD-10-CM | POA: Diagnosis not present

## 2020-04-13 DIAGNOSIS — M6281 Muscle weakness (generalized): Secondary | ICD-10-CM | POA: Diagnosis not present

## 2020-04-20 ENCOUNTER — Other Ambulatory Visit: Payer: Self-pay

## 2020-04-20 ENCOUNTER — Ambulatory Visit (INDEPENDENT_AMBULATORY_CARE_PROVIDER_SITE_OTHER): Payer: Medicare Other | Admitting: Cardiology

## 2020-04-20 ENCOUNTER — Encounter: Payer: Self-pay | Admitting: Cardiology

## 2020-04-20 VITALS — BP 162/76 | HR 60 | Ht 72.0 in | Wt 233.2 lb

## 2020-04-20 DIAGNOSIS — I10 Essential (primary) hypertension: Secondary | ICD-10-CM

## 2020-04-20 DIAGNOSIS — I493 Ventricular premature depolarization: Secondary | ICD-10-CM

## 2020-04-20 DIAGNOSIS — I480 Paroxysmal atrial fibrillation: Secondary | ICD-10-CM

## 2020-04-20 DIAGNOSIS — E1142 Type 2 diabetes mellitus with diabetic polyneuropathy: Secondary | ICD-10-CM

## 2020-04-20 DIAGNOSIS — I4821 Permanent atrial fibrillation: Secondary | ICD-10-CM

## 2020-04-20 DIAGNOSIS — R9439 Abnormal result of other cardiovascular function study: Secondary | ICD-10-CM | POA: Diagnosis not present

## 2020-04-20 MED ORDER — ISOSORBIDE MONONITRATE ER 30 MG PO TB24: 30.0000 mg | ORAL_TABLET | Freq: Every day | ORAL | 3 refills | Status: DC

## 2020-04-20 NOTE — Progress Notes (Signed)
Cardiology Office Note:    Date:  04/20/2020   ID:  Keith Mccall, DOB 01-20-1930, MRN [id number]  PCP:  Ernestene Kiel, MD  Cardiologist:  Berniece Salines, DO  Electrophysiologist:  None   Referring MD: Ernestene Kiel, MD   I have been going to physical therapy and has had also recently a lot of issues with anxiety  History of Present Illness:    Keith Mccall is a 87 y.o. male with a hx of permanent atrial fibrillation on metoprolol however patient reports that he had declined anticoagulation and prefers not to take any anticoagulation, diabetes type 2, hypertension, hyperlipidemia.  I did see the the patient on 10/10/2019 at that time we discussed his stress test results with plans for possible left heart cath. But the patient had to defer the procedure due to significant back pain and his inability to be able to lay flat for the procedure.   I did see the patient October 24, 2019 at that time he wanted to work out his orthopedic with his orthopedic surgeon prior to considering left heart catheterization.  He has been working working with his Doctor, general practice and is planning physical therapy now coming weeks.  I saw the patient in November 18, 2019 at that time I again had a long conversation with the patient about his stress test which was abnormal and the possibility for moving onto the left heart catheterization.  During that visit he did tell me that he would prefer to push his left heart catheterization back as he had been working with his orthopedic surgeon and physical therapist.  Since I saw the patient he has been able to follow the EP and his PCP.  Today he tells me he has had some intermittent chest pain and his mental health therapist had given him some medication which he thinks helps relieve his anxiety and the chest discomfort associated with it.    Past Medical History:  Diagnosis Date  . Anxiety   . Atrial fibrillation ([place])   . Chest pain in adult 12/07/2017   . Comprehensive diabetic foot examination, type 2 DM, encounter for ([place]) 04/10/2019  . Compression fracture of C-spine ([place])   . Contusion of right great toe without damage to nail 04/10/2019  . Diabetic polyneuropathy associated with type 2 diabetes mellitus ([place]) 04/10/2019  . Essential hypertension 12/07/2017  . Hallux hammertoe, right 04/10/2019  . Mixed hyperlipidemia 12/07/2017  . Nonrheumatic aortic valve insufficiency 12/07/2017  . Nonrheumatic mitral valve regurgitation 12/07/2017  . Paroxysmal A-fib ([place]) 12/05/2017  . PVC (premature ventricular contraction) 12/05/2017  . Sick sinus syndrome ([place]) 12/07/2017  . Spinal stenosis     Past Surgical History:  Procedure Laterality Date  . HERNIA REPAIR      Current Medications: Current Meds  Medication Sig  . ACCU-CHEK AVIVA PLUS test strip USE TO CHECK BLOOD SUGAR TWICE A DAY  . acetaminophen (TYLENOL) 500 MG tablet Take 1,000 mg by mouth every 6 (six) hours as needed for mild pain. Rapid release  . ALPRAZolam (XANAX) 1 MG tablet Take 1 mg by mouth 2 (two) times daily as needed for anxiety.  Marland Kitchen co-enzyme Q-10 30 MG capsule Take 30 mg by mouth 3 (three) times daily.  . DHA-EPA-Vit B6-B12-Folic Acid (CARDIOVID PLUS) CAPS Take 2 capsules by mouth 3 (three) times daily with meals.  Kendall Flack 575 MG/5ML SYRP Take 5 mLs by mouth daily. Extract  . furosemide (LASIX) 20 MG tablet Take 20 mg Lasix on Tuesday  and Saturday.  Marland Kitchen Hawthorne Berry 500 MG CAPS Take 500 mg by mouth daily.  Marland Kitchen HYDROcodone-acetaminophen (NORCO/VICODIN) 5-325 MG tablet Take 1 tablet by mouth every 6 (six) hours as needed.  . isosorbide mononitrate (IMDUR) 30 MG 24 hr tablet Take 1 tablet (30 mg total) by mouth daily.  . metoprolol tartrate (LOPRESSOR) 25 MG tablet Take 12.5 mg by mouth 2 (two) times daily.  . nitroGLYCERIN (NITROSTAT) 0.4 MG SL tablet Place 0.4 mg under the tongue every 5 (five) minutes as needed for chest pain.   Marland Kitchen OVER THE COUNTER MEDICATION  Take 2 tablets by mouth 3 (three) times daily with meals. Diaplex  . OVER THE COUNTER MEDICATION Take 1 tablet by mouth 2 (two) times daily with a meal. pancreatrophin pmg  . OVER THE COUNTER MEDICATION Take 1 tablet by mouth 3 (three) times daily with meals. livaplex  . OVER THE COUNTER MEDICATION Take 1-5 tablets by mouth See admin instructions. cataplex e2  Daily and need for tightness in chest or shortness of breath can chew up to 5 tables  . Probiotic Product (PROBIOTIC PO) Take 1 tablet by mouth daily. 32 Billion  . Taurine 1000 MG CAPS Take 1,000 mg by mouth daily.  Marland Kitchen Ubiquinol 50 MG CAPS Take 50 mg by mouth daily.     Allergies:   Amiodarone, Erythromycin base, Polycarbophil, Penicillins, and Sulfa antibiotics   Social History   Socioeconomic History  . Marital status: Married    Spouse name: Not on file  . Number of children: 3  . Years of education: Not on file  . Highest education level: Not on file  Occupational History  . Occupation: retired  Tobacco Use  . Smoking status: Never Smoker  . Smokeless tobacco: Never Used  Substance and Sexual Activity  . Alcohol use: No  . Drug use: No  . Sexual activity: Not on file  Other Topics Concern  . Not on file  Social History Narrative  . Not on file   Social Determinants of Health   Financial Resource Strain: Not on file  Food Insecurity: Not on file  Transportation Needs: Not on file  Physical Activity: Not on file  Stress: Not on file  Social Connections: Not on file     Family History: The patient's family history includes Breast cancer in his sister; CAD in his father; Coronary artery disease in his father; Diabetes in his maternal grandmother, mother, and sister; GI Bleed in his sister; Heart attack in his mother; Heart disease in his mother; Hyperlipidemia in his sister; Hypertension in his father; Lung cancer in his mother; Migraines in his child; Stroke in his father.  ROS:   Review of Systems   Constitution: Negative for decreased appetite, fever and weight gain.  HENT: Negative for congestion, ear discharge, hoarse voice and sore throat.   Eyes: Negative for discharge, redness, vision loss in right eye and visual halos.  Cardiovascular: Negative for chest pain, dyspnea on exertion, leg swelling, orthopnea and palpitations.  Respiratory: Negative for cough, hemoptysis, shortness of breath and snoring.   Endocrine: Negative for heat intolerance and polyphagia.  Hematologic/Lymphatic: Negative for bleeding problem. Does not bruise/bleed easily.  Skin: Negative for flushing, nail changes, rash and suspicious lesions.  Musculoskeletal: Negative for arthritis, joint pain, muscle cramps, myalgias, neck pain and stiffness.  Gastrointestinal: Negative for abdominal pain, bowel incontinence, diarrhea and excessive appetite.  Genitourinary: Negative for decreased libido, genital sores and incomplete emptying.  Neurological: Negative for brief paralysis, focal weakness,  headaches and loss of balance.  Psychiatric/Behavioral: Negative for altered mental status, depression and suicidal ideas.  Allergic/Immunologic: Negative for HIV exposure and persistent infections.    EKGs/Labs/Other Studies Reviewed:    The following studies were reviewed today:   EKG: None today  TTE 12/20/17  Review of the above records today demonstrates:  Moderate mitral annular calcification.  Moderate mitral regurgitation.  There is moderate fibrocalcific sclerosis of the aortic valve with evidence of  mild aortic stenosis with mean gradient across the aortic valve of 10 mmHg and  calculated aortic valve area was 1.26 square centimeters, all suggesting mild  aortic stenosis.  Tricuspid valve is structurally normal.  Mild tricuspid regurgitation.  RVSP 30 mm Hg.  Ejection fraction is visually estimated at 55%  Mild concentric left ventricular hypertrophy  The aortic root diameter is within normal  limits.  The IVC is dilated with collapse during inspiration.   Myoview 10/08/2019.  There was no ST segment deviation noted during stress.  No T wave inversion was noted during stress.  Defect 1: There is a medium defect of mild severity present in the basal inferoseptal location.  Findings consistent with prior myocardial infarction with peri-infarct ischemia.  This is an intermediate risk study.  Non-gated gated study therefore can not determine wall motion abnormalities. With a dilated left ventricular cavity and abnormal TID the study is a intermediate risk. Clinical correlate as patient may need further testing with left heart catheterization   Recent Labs: 10/06/2019: BNP 102.9; Magnesium 2.1 10/10/2019: BUN 17; Creatinine, Ser 0.76; Hemoglobin 13.5; Platelets 175; Potassium 4.2; Sodium 139  Recent Lipid Panel No results found for: CHOL, TRIG, HDL, CHOLHDL, VLDL, LDLCALC, LDLDIRECT  Physical Exam:    VS:  BP (!) 162/76   Pulse 60   Ht 6' (1.829 m)   Wt 233 lb 3.2 oz (105.8 kg)   SpO2 97%   BMI 31.63 kg/m     Wt Readings from Last 3 Encounters:  04/20/20 233 lb 3.2 oz (105.8 kg)  03/09/20 233 lb 7.2 oz (105.9 kg)  12/08/19 236 lb 6.4 oz (107.2 kg)     GEN: Well nourished, well developed in no acute distress HEENT: Normal NECK: No JVD; No carotid bruits LYMPHATICS: No lymphadenopathy CARDIAC: S1S2 noted,RRR, no murmurs, rubs, gallops RESPIRATORY:  Clear to auscultation without rales, wheezing or rhonchi  ABDOMEN: Soft, non-tender, non-distended, +bowel sounds, no guarding. EXTREMITIES: No edema, No cyanosis, no clubbing MUSCULOSKELETAL:  No deformity  SKIN: Warm and dry NEUROLOGIC:  Alert and oriented x 3, non-focal PSYCHIATRIC:  Normal affect, good insight  ASSESSMENT:    1. Abnormal stress test   2. Essential hypertension   3. Paroxysmal A-fib ([place])   4. PVC (premature ventricular contraction)   5. Permanent atrial fibrillation ([place])   6. Diabetic  polyneuropathy associated with type 2 diabetes mellitus ([place])    PLAN:     I spoke to patient about plans moving forward.  We talked about his possibility of coronary artery disease given the abnormal stress test.  He at this time will like to hold off on any procedures.  He tells me that he is working with physical therapy and he is focusing on that.  Given his report of some chest discomfort I offered Imdur 30 mg to help with any angina but the patient has declined this and said he prefer not to take a lot of medications.  He tells me that taking a lot of medication increase his anxiety especially when  he cares about side effects on the television.  Manually his blood pressure taken by me 162/76 mmHg I really would ideally like to help the patient control his blood pressure with a goal 140/90.  He tells me that he and his PCP has decided that 150/90 will be where he stays.  I also told him that despite that his blood pressure is elevated which should address this and at this point he also has declined stating he does not want to take any more additional medications.  I do not fully  agree with the patient's decision for not taking additional antihypertensive medication to help with his blood pressure but I do respect his wishes and will continue to follow with the patient.  The patient is in agreement with the above plan. The patient left the office in stable condition.  The patient will follow up in 6 months or sooner if needed.   Medication Adjustments/Labs and Tests Ordered: Current medicines are reviewed at length with the patient today.  Concerns regarding medicines are outlined above.  No orders of the defined types were placed in this encounter.  Meds ordered this encounter  Medications  . isosorbide mononitrate (IMDUR) 30 MG 24 hr tablet    Sig: Take 1 tablet (30 mg total) by mouth daily.    Dispense:  90 tablet    Refill:  3    Patient Instructions  Medication Instructions:   Your physician has recommended you make the following change in your medication: START: Imdur 30 mg daily *If you need a refill on your cardiac medications before your next appointment, please call your pharmacy*   Lab Work: None If you have labs (blood work) drawn today and your tests are completely normal, you will receive your results only by: Marland Kitchen MyChart Message (if you have MyChart) OR . A paper copy in the mail If you have any lab test that is abnormal or we need to change your treatment, we will call you to review the results.   Testing/Procedures: None   Follow-Up: At [facility], you and your health needs are our priority.  As part of our continuing mission to provide you with exceptional heart care, we have created designated Provider Care Teams.  These Care Teams include your primary Cardiologist (physician) and Advanced Practice Providers (APPs -  Physician Assistants and Nurse Practitioners) who all work together to provide you with the care you need, when you need it.  We recommend signing up for the patient portal called "MyChart".  Sign up information is provided on this After Visit Summary.  MyChart is used to connect with patients for Virtual Visits (Telemedicine).  Patients are able to view lab/test results, encounter notes, upcoming appointments, etc.  Non-urgent messages can be sent to your provider as well.   To learn more about what you can do with MyChart, go to [email or web address].    Your next appointment:   6 month(s)  The format for your next appointment:   In Person  Provider:   Berniece Salines, DO   Other Instructions      Adopting a Healthy Lifestyle.  Know what a healthy weight is for you (roughly BMI <25) and aim to maintain this   Aim for 7+ servings of fruits and vegetables daily   65-80+ fluid ounces of water or unsweet tea for healthy kidneys   Limit to max 1 drink of alcohol per day; avoid smoking/tobacco   Limit animal fats in  diet for cholesterol and  heart health - choose grass fed whenever available   Avoid highly processed foods, and foods high in saturated/trans fats   Aim for low stress - take time to unwind and care for your mental health   Aim for 150 min of moderate intensity exercise weekly for heart health, and weights twice weekly for bone health   Aim for 7-9 hours of sleep daily   When it comes to diets, agreement about the perfect plan isnt easy to find, even among the experts. Experts at the [employer] developed an idea known as the Healthy Eating Plate. Just imagine a plate divided into logical, healthy portions.   The emphasis is on diet quality:   Load up on vegetables and fruits - one-half of your plate: Aim for color and variety, and remember that potatoes dont count.   Go for whole grains - one-quarter of your plate: Whole wheat, barley, wheat berries, quinoa, oats, brown rice, and foods made with them. If you want pasta, go with whole wheat pasta.   Protein power - one-quarter of your plate: Fish, chicken, beans, and nuts are all healthy, versatile protein sources. Limit red meat.   The diet, however, does go beyond the plate, offering a few other suggestions.   Use healthy plant oils, such as olive, canola, soy, corn, sunflower and peanut. Check the labels, and avoid partially hydrogenated oil, which have unhealthy trans fats.   If youre thirsty, drink water. Coffee and tea are good in moderation, but skip sugary drinks and limit milk and dairy products to one or two daily servings.   The type of carbohydrate in the diet is more important than the amount. Some sources of carbohydrates, such as vegetables, fruits, whole grains, and beans-are healthier than others.   Finally, stay active  Signed, Berniece Salines, DO  04/20/2020 3:27 PM    [facility]

## 2020-04-20 NOTE — Patient Instructions (Addendum)

## 2020-04-27 DIAGNOSIS — M6281 Muscle weakness (generalized): Secondary | ICD-10-CM | POA: Diagnosis not present

## 2020-04-27 DIAGNOSIS — R2689 Other abnormalities of gait and mobility: Secondary | ICD-10-CM | POA: Diagnosis not present

## 2020-05-04 DIAGNOSIS — M6281 Muscle weakness (generalized): Secondary | ICD-10-CM | POA: Diagnosis not present

## 2020-05-04 DIAGNOSIS — R2689 Other abnormalities of gait and mobility: Secondary | ICD-10-CM | POA: Diagnosis not present

## 2020-05-10 DIAGNOSIS — M47816 Spondylosis without myelopathy or radiculopathy, lumbar region: Secondary | ICD-10-CM | POA: Diagnosis not present

## 2020-05-10 DIAGNOSIS — E1169 Type 2 diabetes mellitus with other specified complication: Secondary | ICD-10-CM | POA: Diagnosis not present

## 2020-05-10 DIAGNOSIS — I10 Essential (primary) hypertension: Secondary | ICD-10-CM | POA: Diagnosis not present

## 2020-05-10 DIAGNOSIS — F411 Generalized anxiety disorder: Secondary | ICD-10-CM | POA: Diagnosis not present

## 2020-05-10 DIAGNOSIS — Z6832 Body mass index (BMI) 32.0-32.9, adult: Secondary | ICD-10-CM | POA: Diagnosis not present

## 2020-05-10 DIAGNOSIS — K582 Mixed irritable bowel syndrome: Secondary | ICD-10-CM | POA: Diagnosis not present

## 2020-05-10 DIAGNOSIS — I4821 Permanent atrial fibrillation: Secondary | ICD-10-CM | POA: Diagnosis not present

## 2020-05-11 DIAGNOSIS — R2689 Other abnormalities of gait and mobility: Secondary | ICD-10-CM | POA: Diagnosis not present

## 2020-05-11 DIAGNOSIS — M6281 Muscle weakness (generalized): Secondary | ICD-10-CM | POA: Diagnosis not present

## 2020-05-19 DIAGNOSIS — R2689 Other abnormalities of gait and mobility: Secondary | ICD-10-CM | POA: Diagnosis not present

## 2020-05-19 DIAGNOSIS — M6281 Muscle weakness (generalized): Secondary | ICD-10-CM | POA: Diagnosis not present

## 2020-05-25 DIAGNOSIS — M6281 Muscle weakness (generalized): Secondary | ICD-10-CM | POA: Diagnosis not present

## 2020-05-25 DIAGNOSIS — R2689 Other abnormalities of gait and mobility: Secondary | ICD-10-CM | POA: Diagnosis not present

## 2020-06-01 DIAGNOSIS — M4807 Spinal stenosis, lumbosacral region: Secondary | ICD-10-CM | POA: Diagnosis not present

## 2020-06-01 DIAGNOSIS — S32020A Wedge compression fracture of second lumbar vertebra, initial encounter for closed fracture: Secondary | ICD-10-CM | POA: Diagnosis not present

## 2020-06-02 DIAGNOSIS — R2689 Other abnormalities of gait and mobility: Secondary | ICD-10-CM | POA: Diagnosis not present

## 2020-06-02 DIAGNOSIS — M6281 Muscle weakness (generalized): Secondary | ICD-10-CM | POA: Diagnosis not present

## 2020-06-08 DIAGNOSIS — R2689 Other abnormalities of gait and mobility: Secondary | ICD-10-CM | POA: Diagnosis not present

## 2020-06-08 DIAGNOSIS — M6281 Muscle weakness (generalized): Secondary | ICD-10-CM | POA: Diagnosis not present

## 2020-06-15 DIAGNOSIS — R2689 Other abnormalities of gait and mobility: Secondary | ICD-10-CM | POA: Diagnosis not present

## 2020-06-15 DIAGNOSIS — M6281 Muscle weakness (generalized): Secondary | ICD-10-CM | POA: Diagnosis not present

## 2020-06-22 DIAGNOSIS — R2689 Other abnormalities of gait and mobility: Secondary | ICD-10-CM | POA: Diagnosis not present

## 2020-06-22 DIAGNOSIS — M6281 Muscle weakness (generalized): Secondary | ICD-10-CM | POA: Diagnosis not present

## 2020-06-25 DIAGNOSIS — I4891 Unspecified atrial fibrillation: Secondary | ICD-10-CM | POA: Diagnosis not present

## 2020-07-06 DIAGNOSIS — L209 Atopic dermatitis, unspecified: Secondary | ICD-10-CM | POA: Diagnosis not present

## 2020-07-06 DIAGNOSIS — L309 Dermatitis, unspecified: Secondary | ICD-10-CM | POA: Diagnosis not present

## 2020-07-07 DIAGNOSIS — R2689 Other abnormalities of gait and mobility: Secondary | ICD-10-CM | POA: Diagnosis not present

## 2020-07-07 DIAGNOSIS — M6281 Muscle weakness (generalized): Secondary | ICD-10-CM | POA: Diagnosis not present

## 2020-07-12 DIAGNOSIS — R2689 Other abnormalities of gait and mobility: Secondary | ICD-10-CM | POA: Diagnosis not present

## 2020-07-12 DIAGNOSIS — M6281 Muscle weakness (generalized): Secondary | ICD-10-CM | POA: Diagnosis not present

## 2020-07-22 DIAGNOSIS — R2689 Other abnormalities of gait and mobility: Secondary | ICD-10-CM | POA: Diagnosis not present

## 2020-07-22 DIAGNOSIS — M6281 Muscle weakness (generalized): Secondary | ICD-10-CM | POA: Diagnosis not present

## 2020-07-27 DIAGNOSIS — R2689 Other abnormalities of gait and mobility: Secondary | ICD-10-CM | POA: Diagnosis not present

## 2020-07-27 DIAGNOSIS — M6281 Muscle weakness (generalized): Secondary | ICD-10-CM | POA: Diagnosis not present

## 2020-08-04 DIAGNOSIS — M6281 Muscle weakness (generalized): Secondary | ICD-10-CM | POA: Diagnosis not present

## 2020-08-04 DIAGNOSIS — R2689 Other abnormalities of gait and mobility: Secondary | ICD-10-CM | POA: Diagnosis not present

## 2020-08-06 DIAGNOSIS — R9439 Abnormal result of other cardiovascular function study: Secondary | ICD-10-CM | POA: Diagnosis not present

## 2020-08-06 DIAGNOSIS — R5383 Other fatigue: Secondary | ICD-10-CM | POA: Diagnosis not present

## 2020-08-06 DIAGNOSIS — K582 Mixed irritable bowel syndrome: Secondary | ICD-10-CM | POA: Diagnosis not present

## 2020-08-06 DIAGNOSIS — F411 Generalized anxiety disorder: Secondary | ICD-10-CM | POA: Diagnosis not present

## 2020-08-06 DIAGNOSIS — I4821 Permanent atrial fibrillation: Secondary | ICD-10-CM | POA: Diagnosis not present

## 2020-08-06 DIAGNOSIS — Z79899 Other long term (current) drug therapy: Secondary | ICD-10-CM | POA: Diagnosis not present

## 2020-08-06 DIAGNOSIS — E1169 Type 2 diabetes mellitus with other specified complication: Secondary | ICD-10-CM | POA: Diagnosis not present

## 2020-08-06 DIAGNOSIS — I10 Essential (primary) hypertension: Secondary | ICD-10-CM | POA: Diagnosis not present

## 2020-08-06 DIAGNOSIS — M48062 Spinal stenosis, lumbar region with neurogenic claudication: Secondary | ICD-10-CM | POA: Diagnosis not present

## 2020-08-11 DIAGNOSIS — R2689 Other abnormalities of gait and mobility: Secondary | ICD-10-CM | POA: Diagnosis not present

## 2020-08-11 DIAGNOSIS — M6281 Muscle weakness (generalized): Secondary | ICD-10-CM | POA: Diagnosis not present

## 2020-08-27 DIAGNOSIS — N401 Enlarged prostate with lower urinary tract symptoms: Secondary | ICD-10-CM | POA: Diagnosis not present

## 2020-08-27 DIAGNOSIS — R350 Frequency of micturition: Secondary | ICD-10-CM | POA: Diagnosis not present

## 2020-08-27 DIAGNOSIS — N200 Calculus of kidney: Secondary | ICD-10-CM | POA: Diagnosis not present

## 2020-08-30 DIAGNOSIS — I340 Nonrheumatic mitral (valve) insufficiency: Secondary | ICD-10-CM | POA: Diagnosis not present

## 2020-08-30 DIAGNOSIS — I10 Essential (primary) hypertension: Secondary | ICD-10-CM | POA: Diagnosis not present

## 2020-08-30 DIAGNOSIS — I495 Sick sinus syndrome: Secondary | ICD-10-CM | POA: Diagnosis not present

## 2020-08-30 DIAGNOSIS — I4891 Unspecified atrial fibrillation: Secondary | ICD-10-CM | POA: Diagnosis not present

## 2020-08-30 DIAGNOSIS — I351 Nonrheumatic aortic (valve) insufficiency: Secondary | ICD-10-CM | POA: Diagnosis not present

## 2020-08-30 DIAGNOSIS — R9439 Abnormal result of other cardiovascular function study: Secondary | ICD-10-CM | POA: Diagnosis not present

## 2020-08-30 DIAGNOSIS — I480 Paroxysmal atrial fibrillation: Secondary | ICD-10-CM | POA: Diagnosis not present

## 2020-09-15 DIAGNOSIS — M5459 Other low back pain: Secondary | ICD-10-CM | POA: Diagnosis not present

## 2020-09-15 DIAGNOSIS — M6281 Muscle weakness (generalized): Secondary | ICD-10-CM | POA: Diagnosis not present

## 2020-09-22 DIAGNOSIS — M5459 Other low back pain: Secondary | ICD-10-CM | POA: Diagnosis not present

## 2020-09-22 DIAGNOSIS — M6281 Muscle weakness (generalized): Secondary | ICD-10-CM | POA: Diagnosis not present

## 2020-09-23 DIAGNOSIS — E119 Type 2 diabetes mellitus without complications: Secondary | ICD-10-CM | POA: Diagnosis not present

## 2020-09-24 DIAGNOSIS — M47816 Spondylosis without myelopathy or radiculopathy, lumbar region: Secondary | ICD-10-CM | POA: Diagnosis not present

## 2020-09-28 DIAGNOSIS — I340 Nonrheumatic mitral (valve) insufficiency: Secondary | ICD-10-CM | POA: Diagnosis not present

## 2020-09-28 DIAGNOSIS — K582 Mixed irritable bowel syndrome: Secondary | ICD-10-CM | POA: Diagnosis not present

## 2020-09-28 DIAGNOSIS — R079 Chest pain, unspecified: Secondary | ICD-10-CM | POA: Diagnosis not present

## 2020-09-28 DIAGNOSIS — D6859 Other primary thrombophilia: Secondary | ICD-10-CM | POA: Diagnosis not present

## 2020-09-28 DIAGNOSIS — M5136 Other intervertebral disc degeneration, lumbar region: Secondary | ICD-10-CM | POA: Diagnosis not present

## 2020-09-28 DIAGNOSIS — I4821 Permanent atrial fibrillation: Secondary | ICD-10-CM | POA: Diagnosis not present

## 2020-09-29 DIAGNOSIS — M6281 Muscle weakness (generalized): Secondary | ICD-10-CM | POA: Diagnosis not present

## 2020-09-29 DIAGNOSIS — M5459 Other low back pain: Secondary | ICD-10-CM | POA: Diagnosis not present

## 2020-10-04 DIAGNOSIS — I361 Nonrheumatic tricuspid (valve) insufficiency: Secondary | ICD-10-CM | POA: Diagnosis not present

## 2020-10-04 DIAGNOSIS — I340 Nonrheumatic mitral (valve) insufficiency: Secondary | ICD-10-CM | POA: Diagnosis not present

## 2020-10-13 DIAGNOSIS — M5459 Other low back pain: Secondary | ICD-10-CM | POA: Diagnosis not present

## 2020-10-13 DIAGNOSIS — M6281 Muscle weakness (generalized): Secondary | ICD-10-CM | POA: Diagnosis not present

## 2020-11-11 DIAGNOSIS — L719 Rosacea, unspecified: Secondary | ICD-10-CM | POA: Diagnosis not present

## 2020-11-11 DIAGNOSIS — L820 Inflamed seborrheic keratosis: Secondary | ICD-10-CM | POA: Diagnosis not present

## 2020-11-11 DIAGNOSIS — L300 Nummular dermatitis: Secondary | ICD-10-CM | POA: Diagnosis not present

## 2020-11-18 DIAGNOSIS — N401 Enlarged prostate with lower urinary tract symptoms: Secondary | ICD-10-CM | POA: Diagnosis not present

## 2020-11-18 DIAGNOSIS — N3281 Overactive bladder: Secondary | ICD-10-CM | POA: Diagnosis not present

## 2020-11-18 DIAGNOSIS — R351 Nocturia: Secondary | ICD-10-CM | POA: Diagnosis not present

## 2020-12-06 DIAGNOSIS — I482 Chronic atrial fibrillation, unspecified: Secondary | ICD-10-CM | POA: Diagnosis not present

## 2020-12-06 DIAGNOSIS — I340 Nonrheumatic mitral (valve) insufficiency: Secondary | ICD-10-CM | POA: Diagnosis not present

## 2020-12-06 DIAGNOSIS — I10 Essential (primary) hypertension: Secondary | ICD-10-CM | POA: Diagnosis not present

## 2020-12-06 DIAGNOSIS — I480 Paroxysmal atrial fibrillation: Secondary | ICD-10-CM | POA: Diagnosis not present

## 2020-12-06 DIAGNOSIS — I493 Ventricular premature depolarization: Secondary | ICD-10-CM | POA: Diagnosis not present

## 2020-12-08 DIAGNOSIS — R319 Hematuria, unspecified: Secondary | ICD-10-CM | POA: Diagnosis not present

## 2020-12-08 DIAGNOSIS — E1169 Type 2 diabetes mellitus with other specified complication: Secondary | ICD-10-CM | POA: Diagnosis not present

## 2020-12-08 DIAGNOSIS — Z79899 Other long term (current) drug therapy: Secondary | ICD-10-CM | POA: Diagnosis not present

## 2020-12-08 DIAGNOSIS — I4821 Permanent atrial fibrillation: Secondary | ICD-10-CM | POA: Diagnosis not present

## 2020-12-17 DIAGNOSIS — R3915 Urgency of urination: Secondary | ICD-10-CM | POA: Diagnosis not present

## 2020-12-17 DIAGNOSIS — R3121 Asymptomatic microscopic hematuria: Secondary | ICD-10-CM | POA: Diagnosis not present

## 2020-12-17 DIAGNOSIS — N401 Enlarged prostate with lower urinary tract symptoms: Secondary | ICD-10-CM | POA: Diagnosis not present

## 2020-12-22 ENCOUNTER — Encounter: Payer: Self-pay | Admitting: Gastroenterology

## 2021-01-25 DIAGNOSIS — S32020A Wedge compression fracture of second lumbar vertebra, initial encounter for closed fracture: Secondary | ICD-10-CM | POA: Diagnosis not present

## 2021-01-25 DIAGNOSIS — M47816 Spondylosis without myelopathy or radiculopathy, lumbar region: Secondary | ICD-10-CM | POA: Diagnosis not present

## 2021-01-25 DIAGNOSIS — I10 Essential (primary) hypertension: Secondary | ICD-10-CM | POA: Diagnosis not present

## 2021-01-25 DIAGNOSIS — Z6831 Body mass index (BMI) 31.0-31.9, adult: Secondary | ICD-10-CM | POA: Diagnosis not present

## 2021-01-26 DIAGNOSIS — H5789 Other specified disorders of eye and adnexa: Secondary | ICD-10-CM | POA: Diagnosis not present

## 2021-01-26 DIAGNOSIS — H1011 Acute atopic conjunctivitis, right eye: Secondary | ICD-10-CM | POA: Diagnosis not present

## 2021-01-26 DIAGNOSIS — Z6833 Body mass index (BMI) 33.0-33.9, adult: Secondary | ICD-10-CM | POA: Diagnosis not present

## 2021-01-26 DIAGNOSIS — I4821 Permanent atrial fibrillation: Secondary | ICD-10-CM | POA: Diagnosis not present

## 2021-01-26 DIAGNOSIS — E1169 Type 2 diabetes mellitus with other specified complication: Secondary | ICD-10-CM | POA: Diagnosis not present

## 2021-01-27 ENCOUNTER — Other Ambulatory Visit: Payer: Self-pay

## 2021-01-27 ENCOUNTER — Ambulatory Visit (INDEPENDENT_AMBULATORY_CARE_PROVIDER_SITE_OTHER): Payer: Medicare Other | Admitting: Gastroenterology

## 2021-01-27 ENCOUNTER — Encounter: Payer: Self-pay | Admitting: Gastroenterology

## 2021-01-27 VITALS — BP 128/78 | HR 67 | Ht 72.0 in | Wt 230.0 lb

## 2021-01-27 DIAGNOSIS — K581 Irritable bowel syndrome with constipation: Secondary | ICD-10-CM
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
## 2021-01-27 NOTE — Patient Instructions (Signed)
If you are age 86 or older, your body mass index should be between 23-30. Your Body mass index is 31.19 kg/m. If this is out of the aforementioned range listed, please consider follow up with your Primary Care Provider.  If you are age 8 or younger, your body mass index should be between 19-25. Your Body mass index is 31.19 kg/m. If this is out of the aformentioned range listed, please consider follow up with your Primary Care Provider.   ________________________________________________________  The  GI providers would like to encourage you to use [facility] to communicate with providers for non-urgent requests or questions.  Due to long hold times on the telephone, sending your provider a message by [facility] may be a faster and more efficient way to get a response.  Please allow 48 business hours for a response.  Please remember that this is for non-urgent requests.  _______________________________________________________  Please purchase the following medications over the counter and take as directed: Miralax 17g daily  If in 3 days you haven't had a bowel movement then try milk of magnesium.  Please call with any questions.  Thank you,  Dr. Jackquline Denmark

## 2021-01-28 DIAGNOSIS — M545 Low back pain, unspecified: Secondary | ICD-10-CM | POA: Diagnosis not present

## 2021-01-28 DIAGNOSIS — R296 Repeated falls: Secondary | ICD-10-CM | POA: Diagnosis not present

## 2021-02-03 DIAGNOSIS — L570 Actinic keratosis: Secondary | ICD-10-CM | POA: Diagnosis not present

## 2021-02-03 DIAGNOSIS — L719 Rosacea, unspecified: Secondary | ICD-10-CM | POA: Diagnosis not present

## 2021-02-04 DIAGNOSIS — R296 Repeated falls: Secondary | ICD-10-CM | POA: Diagnosis not present

## 2021-02-04 DIAGNOSIS — M545 Low back pain, unspecified: Secondary | ICD-10-CM | POA: Diagnosis not present

## 2021-02-11 DIAGNOSIS — M545 Low back pain, unspecified: Secondary | ICD-10-CM | POA: Diagnosis not present

## 2021-02-11 DIAGNOSIS — R296 Repeated falls: Secondary | ICD-10-CM | POA: Diagnosis not present

## 2021-02-24 DIAGNOSIS — L209 Atopic dermatitis, unspecified: Secondary | ICD-10-CM | POA: Diagnosis not present

## 2021-02-25 DIAGNOSIS — M545 Low back pain, unspecified: Secondary | ICD-10-CM | POA: Diagnosis not present

## 2021-02-25 DIAGNOSIS — R296 Repeated falls: Secondary | ICD-10-CM | POA: Diagnosis not present

## 2021-03-04 DIAGNOSIS — M545 Low back pain, unspecified: Secondary | ICD-10-CM | POA: Diagnosis not present

## 2021-03-04 DIAGNOSIS — R296 Repeated falls: Secondary | ICD-10-CM | POA: Diagnosis not present

## 2021-03-22 DIAGNOSIS — F419 Anxiety disorder, unspecified: Secondary | ICD-10-CM | POA: Diagnosis not present

## 2021-03-22 DIAGNOSIS — K589 Irritable bowel syndrome without diarrhea: Secondary | ICD-10-CM | POA: Diagnosis not present

## 2021-03-22 DIAGNOSIS — R5383 Other fatigue: Secondary | ICD-10-CM | POA: Diagnosis not present

## 2021-03-22 DIAGNOSIS — I4891 Unspecified atrial fibrillation: Secondary | ICD-10-CM | POA: Diagnosis not present

## 2021-03-22 DIAGNOSIS — Z6831 Body mass index (BMI) 31.0-31.9, adult: Secondary | ICD-10-CM | POA: Diagnosis not present

## 2021-03-22 DIAGNOSIS — E1165 Type 2 diabetes mellitus with hyperglycemia: Secondary | ICD-10-CM | POA: Diagnosis not present

## 2021-03-22 DIAGNOSIS — Z13220 Encounter for screening for lipoid disorders: Secondary | ICD-10-CM | POA: Diagnosis not present

## 2021-03-25 DIAGNOSIS — D510 Vitamin B12 deficiency anemia due to intrinsic factor deficiency: Secondary | ICD-10-CM | POA: Diagnosis not present

## 2021-03-29 DIAGNOSIS — S32020A Wedge compression fracture of second lumbar vertebra, initial encounter for closed fracture: Secondary | ICD-10-CM | POA: Diagnosis not present

## 2021-04-01 DIAGNOSIS — D510 Vitamin B12 deficiency anemia due to intrinsic factor deficiency: Secondary | ICD-10-CM | POA: Diagnosis not present

## 2021-04-08 DIAGNOSIS — D519 Vitamin B12 deficiency anemia, unspecified: Secondary | ICD-10-CM | POA: Diagnosis not present

## 2021-04-15 DIAGNOSIS — D519 Vitamin B12 deficiency anemia, unspecified: Secondary | ICD-10-CM | POA: Diagnosis not present

## 2021-04-18 DIAGNOSIS — I480 Paroxysmal atrial fibrillation: Secondary | ICD-10-CM | POA: Diagnosis not present

## 2021-04-18 DIAGNOSIS — I495 Sick sinus syndrome: Secondary | ICD-10-CM | POA: Diagnosis not present

## 2021-04-18 DIAGNOSIS — R079 Chest pain, unspecified: Secondary | ICD-10-CM | POA: Diagnosis not present

## 2021-04-18 DIAGNOSIS — I482 Chronic atrial fibrillation, unspecified: Secondary | ICD-10-CM | POA: Diagnosis not present

## 2021-04-18 DIAGNOSIS — I351 Nonrheumatic aortic (valve) insufficiency: Secondary | ICD-10-CM | POA: Diagnosis not present

## 2021-04-18 DIAGNOSIS — I493 Ventricular premature depolarization: Secondary | ICD-10-CM | POA: Diagnosis not present

## 2021-04-18 DIAGNOSIS — I340 Nonrheumatic mitral (valve) insufficiency: Secondary | ICD-10-CM | POA: Diagnosis not present

## 2021-04-18 DIAGNOSIS — I10 Essential (primary) hypertension: Secondary | ICD-10-CM | POA: Diagnosis not present

## 2021-04-18 DIAGNOSIS — E782 Mixed hyperlipidemia: Secondary | ICD-10-CM | POA: Diagnosis not present

## 2021-04-25 ENCOUNTER — Ambulatory Visit: Payer: Medicare Other | Admitting: Cardiology

## 2021-04-29 DIAGNOSIS — M545 Low back pain, unspecified: Secondary | ICD-10-CM | POA: Diagnosis not present

## 2021-04-29 DIAGNOSIS — E1165 Type 2 diabetes mellitus with hyperglycemia: Secondary | ICD-10-CM | POA: Diagnosis not present

## 2021-04-29 DIAGNOSIS — Z1331 Encounter for screening for depression: Secondary | ICD-10-CM | POA: Diagnosis not present

## 2021-04-29 DIAGNOSIS — Z6830 Body mass index (BMI) 30.0-30.9, adult: Secondary | ICD-10-CM | POA: Diagnosis not present

## 2021-04-29 DIAGNOSIS — Z0000 Encounter for general adult medical examination without abnormal findings: Secondary | ICD-10-CM | POA: Diagnosis not present

## 2021-04-29 DIAGNOSIS — M1990 Unspecified osteoarthritis, unspecified site: Secondary | ICD-10-CM | POA: Diagnosis not present

## 2021-04-29 DIAGNOSIS — N400 Enlarged prostate without lower urinary tract symptoms: Secondary | ICD-10-CM | POA: Diagnosis not present

## 2021-05-11 DIAGNOSIS — D519 Vitamin B12 deficiency anemia, unspecified: Secondary | ICD-10-CM | POA: Diagnosis not present

## 2021-05-12 DIAGNOSIS — R2681 Unsteadiness on feet: Secondary | ICD-10-CM | POA: Diagnosis not present

## 2021-05-12 DIAGNOSIS — R531 Weakness: Secondary | ICD-10-CM | POA: Diagnosis not present

## 2021-05-12 DIAGNOSIS — M545 Low back pain, unspecified: Secondary | ICD-10-CM | POA: Diagnosis not present

## 2021-05-16 DIAGNOSIS — Z20822 Contact with and (suspected) exposure to covid-19: Secondary | ICD-10-CM | POA: Diagnosis not present

## 2021-05-20 DIAGNOSIS — R531 Weakness: Secondary | ICD-10-CM | POA: Diagnosis not present

## 2021-05-20 DIAGNOSIS — M545 Low back pain, unspecified: Secondary | ICD-10-CM | POA: Diagnosis not present

## 2021-05-20 DIAGNOSIS — R2681 Unsteadiness on feet: Secondary | ICD-10-CM | POA: Diagnosis not present

## 2021-05-27 DIAGNOSIS — R531 Weakness: Secondary | ICD-10-CM | POA: Diagnosis not present

## 2021-05-27 DIAGNOSIS — M545 Low back pain, unspecified: Secondary | ICD-10-CM | POA: Diagnosis not present

## 2021-05-27 DIAGNOSIS — R2681 Unsteadiness on feet: Secondary | ICD-10-CM | POA: Diagnosis not present

## 2021-06-06 DIAGNOSIS — M545 Low back pain, unspecified: Secondary | ICD-10-CM | POA: Diagnosis not present

## 2021-06-06 DIAGNOSIS — R2681 Unsteadiness on feet: Secondary | ICD-10-CM | POA: Diagnosis not present

## 2021-06-06 DIAGNOSIS — R531 Weakness: Secondary | ICD-10-CM | POA: Diagnosis not present

## 2021-06-15 DIAGNOSIS — R531 Weakness: Secondary | ICD-10-CM | POA: Diagnosis not present

## 2021-06-15 DIAGNOSIS — M545 Low back pain, unspecified: Secondary | ICD-10-CM | POA: Diagnosis not present

## 2021-06-15 DIAGNOSIS — R2681 Unsteadiness on feet: Secondary | ICD-10-CM | POA: Diagnosis not present

## 2021-06-15 DIAGNOSIS — D519 Vitamin B12 deficiency anemia, unspecified: Secondary | ICD-10-CM | POA: Diagnosis not present

## 2021-06-21 DIAGNOSIS — R350 Frequency of micturition: Secondary | ICD-10-CM | POA: Diagnosis not present

## 2021-06-21 DIAGNOSIS — F411 Generalized anxiety disorder: Secondary | ICD-10-CM | POA: Diagnosis not present

## 2021-06-21 DIAGNOSIS — E1165 Type 2 diabetes mellitus with hyperglycemia: Secondary | ICD-10-CM | POA: Diagnosis not present

## 2021-06-21 DIAGNOSIS — F410 Panic disorder [episodic paroxysmal anxiety] without agoraphobia: Secondary | ICD-10-CM | POA: Diagnosis not present

## 2021-06-21 DIAGNOSIS — I4891 Unspecified atrial fibrillation: Secondary | ICD-10-CM | POA: Diagnosis not present

## 2021-06-21 DIAGNOSIS — R300 Dysuria: Secondary | ICD-10-CM | POA: Diagnosis not present

## 2021-07-18 DIAGNOSIS — E782 Mixed hyperlipidemia: Secondary | ICD-10-CM | POA: Diagnosis not present

## 2021-07-18 DIAGNOSIS — I480 Paroxysmal atrial fibrillation: Secondary | ICD-10-CM | POA: Diagnosis not present

## 2021-07-18 DIAGNOSIS — I10 Essential (primary) hypertension: Secondary | ICD-10-CM | POA: Diagnosis not present

## 2021-07-18 DIAGNOSIS — R0789 Other chest pain: Secondary | ICD-10-CM | POA: Diagnosis not present

## 2021-07-18 DIAGNOSIS — I493 Ventricular premature depolarization: Secondary | ICD-10-CM | POA: Diagnosis not present

## 2021-07-18 DIAGNOSIS — I340 Nonrheumatic mitral (valve) insufficiency: Secondary | ICD-10-CM | POA: Diagnosis not present

## 2021-07-18 DIAGNOSIS — I351 Nonrheumatic aortic (valve) insufficiency: Secondary | ICD-10-CM | POA: Diagnosis not present

## 2021-07-21 DIAGNOSIS — D519 Vitamin B12 deficiency anemia, unspecified: Secondary | ICD-10-CM | POA: Diagnosis not present

## 2021-08-01 ENCOUNTER — Telehealth: Payer: Self-pay | Admitting: Gastroenterology

## 2021-08-01 NOTE — Telephone Encounter (Signed)
Inbound call from patient stating that he would like to speak to the nurse, patient stated that he is having diarrhea and cramping. Patient is scheduled to see Dr. Lyndel Safe on 8/22. Please advise.

## 2021-08-01 NOTE — Telephone Encounter (Signed)
Pt stated that he has been having alternating BM moments from diarrhea to regular, some abdominal discomfort and had two episodes of incontinence recently. Pt is concerned about stomach cancer.Pt was questioned about any recent change in his diet:  Pt stated that he started eating oatmeal recently along tomatoes: Pt stated that he does have a hx of diverticuloses years ago. Pt was encouraged to stop eating the tomatoes which have lots of acid and also include lots of seeds: Pt has follow up previously scheduled to see Dr. Lyndel Safe: Pt stated that he will see his PCP tomorrow: Pt verbalized understanding with all questions answered.

## 2021-08-02 DIAGNOSIS — L8992 Pressure ulcer of unspecified site, stage 2: Secondary | ICD-10-CM | POA: Diagnosis not present

## 2021-08-02 DIAGNOSIS — N3001 Acute cystitis with hematuria: Secondary | ICD-10-CM | POA: Diagnosis not present

## 2021-08-02 DIAGNOSIS — D510 Vitamin B12 deficiency anemia due to intrinsic factor deficiency: Secondary | ICD-10-CM | POA: Diagnosis not present

## 2021-08-02 DIAGNOSIS — I4891 Unspecified atrial fibrillation: Secondary | ICD-10-CM | POA: Diagnosis not present

## 2021-08-02 DIAGNOSIS — E1165 Type 2 diabetes mellitus with hyperglycemia: Secondary | ICD-10-CM | POA: Diagnosis not present

## 2021-08-18 DIAGNOSIS — I351 Nonrheumatic aortic (valve) insufficiency: Secondary | ICD-10-CM | POA: Diagnosis not present

## 2021-08-18 DIAGNOSIS — I340 Nonrheumatic mitral (valve) insufficiency: Secondary | ICD-10-CM | POA: Diagnosis not present

## 2021-08-24 DIAGNOSIS — D225 Melanocytic nevi of trunk: Secondary | ICD-10-CM | POA: Diagnosis not present

## 2021-08-24 DIAGNOSIS — L821 Other seborrheic keratosis: Secondary | ICD-10-CM | POA: Diagnosis not present

## 2021-08-24 DIAGNOSIS — L719 Rosacea, unspecified: Secondary | ICD-10-CM | POA: Diagnosis not present

## 2021-08-24 DIAGNOSIS — Z1211 Encounter for screening for malignant neoplasm of colon: Secondary | ICD-10-CM | POA: Diagnosis not present

## 2021-08-24 DIAGNOSIS — D2239 Melanocytic nevi of other parts of face: Secondary | ICD-10-CM | POA: Diagnosis not present

## 2021-08-24 DIAGNOSIS — L570 Actinic keratosis: Secondary | ICD-10-CM | POA: Diagnosis not present

## 2021-08-31 DIAGNOSIS — M79605 Pain in left leg: Secondary | ICD-10-CM | POA: Diagnosis not present

## 2021-08-31 DIAGNOSIS — W19XXXS Unspecified fall, sequela: Secondary | ICD-10-CM | POA: Diagnosis not present

## 2021-08-31 DIAGNOSIS — M6281 Muscle weakness (generalized): Secondary | ICD-10-CM | POA: Diagnosis not present

## 2021-08-31 DIAGNOSIS — R2689 Other abnormalities of gait and mobility: Secondary | ICD-10-CM | POA: Diagnosis not present

## 2021-09-06 ENCOUNTER — Ambulatory Visit (INDEPENDENT_AMBULATORY_CARE_PROVIDER_SITE_OTHER): Payer: Medicare Other | Admitting: Gastroenterology

## 2021-09-06 ENCOUNTER — Encounter: Payer: Self-pay | Admitting: Gastroenterology

## 2021-09-06 VITALS — BP 140/90 | HR 83 | Ht 72.0 in | Wt 222.0 lb

## 2021-09-06 DIAGNOSIS — K581 Irritable bowel syndrome with constipation: Secondary | ICD-10-CM

## 2021-09-06 NOTE — Patient Instructions (Addendum)
_______________________________________________________  If you are age 86 or older, your body mass index should be between 23-30. Your Body mass index is 30.11 kg/m. If this is out of the aforementioned range listed, please consider follow up with your Primary Care Provider.  If you are age 106 or younger, your body mass index should be between 19-25. Your Body mass index is 30.11 kg/m. If this is out of the aformentioned range listed, please consider follow up with your Primary Care Provider.   ________________________________________________________  The [facility] providers would like to encourage you to use [facility] to communicate with providers for non-urgent requests or questions.  Due to long hold times on the telephone, sending your provider a message by [facility] may be a faster and more efficient way to get a response.  Please allow 48 business hours for a response.  Please remember that this is for non-urgent requests.  _______________________________________________________  Continue 1 senakot at night  Can use MOM 15-27m if there is no bowel movement in 2-3 days  Call with any questions or concerns.  Thank you,  Dr. RJackquline Denmark
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
## 2021-09-08 DIAGNOSIS — R079 Chest pain, unspecified: Secondary | ICD-10-CM | POA: Diagnosis not present

## 2021-09-08 DIAGNOSIS — I495 Sick sinus syndrome: Secondary | ICD-10-CM | POA: Diagnosis not present

## 2021-09-08 DIAGNOSIS — R0789 Other chest pain: Secondary | ICD-10-CM | POA: Diagnosis not present

## 2021-09-08 DIAGNOSIS — I493 Ventricular premature depolarization: Secondary | ICD-10-CM | POA: Diagnosis not present

## 2021-09-08 DIAGNOSIS — I10 Essential (primary) hypertension: Secondary | ICD-10-CM | POA: Diagnosis not present

## 2021-09-08 DIAGNOSIS — I351 Nonrheumatic aortic (valve) insufficiency: Secondary | ICD-10-CM | POA: Diagnosis not present

## 2021-09-08 DIAGNOSIS — E0843 Diabetes mellitus due to underlying condition with diabetic autonomic (poly)neuropathy: Secondary | ICD-10-CM | POA: Diagnosis not present

## 2021-09-08 DIAGNOSIS — I480 Paroxysmal atrial fibrillation: Secondary | ICD-10-CM | POA: Diagnosis not present

## 2021-09-08 DIAGNOSIS — R9439 Abnormal result of other cardiovascular function study: Secondary | ICD-10-CM | POA: Diagnosis not present

## 2021-09-08 DIAGNOSIS — R9431 Abnormal electrocardiogram [ECG] [EKG]: Secondary | ICD-10-CM | POA: Diagnosis not present

## 2021-09-11 DIAGNOSIS — I493 Ventricular premature depolarization: Secondary | ICD-10-CM | POA: Diagnosis not present

## 2021-09-11 DIAGNOSIS — I480 Paroxysmal atrial fibrillation: Secondary | ICD-10-CM | POA: Diagnosis not present

## 2021-10-06 DIAGNOSIS — I495 Sick sinus syndrome: Secondary | ICD-10-CM | POA: Diagnosis not present

## 2021-10-06 DIAGNOSIS — R9439 Abnormal result of other cardiovascular function study: Secondary | ICD-10-CM | POA: Diagnosis not present

## 2021-10-06 DIAGNOSIS — Z79899 Other long term (current) drug therapy: Secondary | ICD-10-CM | POA: Diagnosis not present

## 2021-10-06 DIAGNOSIS — I080 Rheumatic disorders of both mitral and aortic valves: Secondary | ICD-10-CM | POA: Diagnosis not present

## 2021-10-06 DIAGNOSIS — E1143 Type 2 diabetes mellitus with diabetic autonomic (poly)neuropathy: Secondary | ICD-10-CM | POA: Diagnosis not present

## 2021-10-06 DIAGNOSIS — I482 Chronic atrial fibrillation, unspecified: Secondary | ICD-10-CM | POA: Diagnosis not present

## 2021-10-06 DIAGNOSIS — I2510 Atherosclerotic heart disease of native coronary artery without angina pectoris: Secondary | ICD-10-CM | POA: Diagnosis not present

## 2021-10-06 DIAGNOSIS — I083 Combined rheumatic disorders of mitral, aortic and tricuspid valves: Secondary | ICD-10-CM | POA: Diagnosis not present

## 2021-10-06 DIAGNOSIS — G8929 Other chronic pain: Secondary | ICD-10-CM | POA: Diagnosis not present

## 2021-10-06 DIAGNOSIS — M4800 Spinal stenosis, site unspecified: Secondary | ICD-10-CM | POA: Diagnosis not present

## 2021-10-06 DIAGNOSIS — I480 Paroxysmal atrial fibrillation: Secondary | ICD-10-CM | POA: Diagnosis not present

## 2021-10-06 DIAGNOSIS — I2584 Coronary atherosclerosis due to calcified coronary lesion: Secondary | ICD-10-CM | POA: Diagnosis not present

## 2021-10-06 DIAGNOSIS — Z01818 Encounter for other preprocedural examination: Secondary | ICD-10-CM | POA: Diagnosis not present

## 2021-10-06 DIAGNOSIS — I10 Essential (primary) hypertension: Secondary | ICD-10-CM | POA: Diagnosis not present

## 2021-10-17 DIAGNOSIS — I351 Nonrheumatic aortic (valve) insufficiency: Secondary | ICD-10-CM | POA: Diagnosis not present

## 2021-10-17 DIAGNOSIS — I495 Sick sinus syndrome: Secondary | ICD-10-CM | POA: Diagnosis not present

## 2021-10-17 DIAGNOSIS — E1142 Type 2 diabetes mellitus with diabetic polyneuropathy: Secondary | ICD-10-CM | POA: Diagnosis not present

## 2021-10-17 DIAGNOSIS — I340 Nonrheumatic mitral (valve) insufficiency: Secondary | ICD-10-CM | POA: Diagnosis not present

## 2021-10-17 DIAGNOSIS — E782 Mixed hyperlipidemia: Secondary | ICD-10-CM | POA: Diagnosis not present

## 2021-10-17 DIAGNOSIS — I4811 Longstanding persistent atrial fibrillation: Secondary | ICD-10-CM | POA: Diagnosis not present

## 2021-10-17 DIAGNOSIS — I493 Ventricular premature depolarization: Secondary | ICD-10-CM | POA: Diagnosis not present

## 2021-10-17 DIAGNOSIS — I10 Essential (primary) hypertension: Secondary | ICD-10-CM | POA: Diagnosis not present

## 2021-10-17 DIAGNOSIS — I25118 Atherosclerotic heart disease of native coronary artery with other forms of angina pectoris: Secondary | ICD-10-CM | POA: Diagnosis not present

## 2021-10-18 DIAGNOSIS — R350 Frequency of micturition: Secondary | ICD-10-CM | POA: Diagnosis not present

## 2021-10-18 DIAGNOSIS — E1165 Type 2 diabetes mellitus with hyperglycemia: Secondary | ICD-10-CM | POA: Diagnosis not present

## 2021-10-18 DIAGNOSIS — N3001 Acute cystitis with hematuria: Secondary | ICD-10-CM | POA: Diagnosis not present

## 2021-10-18 DIAGNOSIS — I2510 Atherosclerotic heart disease of native coronary artery without angina pectoris: Secondary | ICD-10-CM | POA: Diagnosis not present

## 2021-10-18 DIAGNOSIS — Z6830 Body mass index (BMI) 30.0-30.9, adult: Secondary | ICD-10-CM | POA: Diagnosis not present

## 2021-11-07 DIAGNOSIS — I4891 Unspecified atrial fibrillation: Secondary | ICD-10-CM | POA: Diagnosis not present

## 2021-11-07 DIAGNOSIS — R5381 Other malaise: Secondary | ICD-10-CM | POA: Diagnosis not present

## 2021-11-07 DIAGNOSIS — F324 Major depressive disorder, single episode, in partial remission: Secondary | ICD-10-CM | POA: Diagnosis not present

## 2021-11-07 DIAGNOSIS — E1165 Type 2 diabetes mellitus with hyperglycemia: Secondary | ICD-10-CM | POA: Diagnosis not present

## 2021-12-01 ENCOUNTER — Telehealth: Payer: Self-pay | Admitting: Gastroenterology

## 2021-12-01 NOTE — Telephone Encounter (Signed)
Patient is calling states that he is taking his medications but his IBS has began to bother him and is wondering how to move forward. Please advise

## 2021-12-02 NOTE — Telephone Encounter (Signed)
Pt stated that he has been having increase in reflux and requesting an office visit with Dr. Lyndel Safe: pt scheduled to see Dr. Lyndel Safe on 01/04/2022 at 8:50 am: Pt made aware. Pt verbalized understanding with all questions answered.

## 2021-12-19 ENCOUNTER — Telehealth: Payer: Self-pay | Admitting: Cardiology

## 2021-12-19 NOTE — Telephone Encounter (Signed)
Pt's friend would like a callback regarding appt. Please advise    FYI - Pt was a pt of Dr. Harriet Masson

## 2021-12-19 NOTE — Telephone Encounter (Signed)
Called pt's friend and explained she was not on DPR to speak with her. She asked if we would call and make an appt to See Dr. Agustin Cree. Sent to front desk to make appt.

## 2021-12-21 ENCOUNTER — Telehealth: Payer: Self-pay | Admitting: Cardiology

## 2021-12-21 NOTE — Telephone Encounter (Signed)
Pt's spouse would like a callback regarding request for pt's medical records. Please advise

## 2021-12-22 DIAGNOSIS — C44311 Basal cell carcinoma of skin of nose: Secondary | ICD-10-CM | POA: Diagnosis not present

## 2021-12-22 NOTE — Telephone Encounter (Signed)
Left vm for pt to callback 

## 2021-12-22 NOTE — Telephone Encounter (Signed)
Pt is calling requesting update on pt's medical record from his other cardiologist from procedure on 09/21.  He would like to make sure that all of the information from procedure is available from that procedure for Dr. Agustin Cree to have so he can get the second opinion he needs.  He is being told from both offices that the other office needs to fax a request to get that information to Dr. Agustin Cree. He would like a call back to find out what exactly he needs to do to have this information in time for his appt on 12/28. Please advise.

## 2021-12-23 NOTE — Telephone Encounter (Signed)
Spoke with the patient, will come by today to sign medical records release.

## 2021-12-26 DIAGNOSIS — I10 Essential (primary) hypertension: Secondary | ICD-10-CM | POA: Diagnosis not present

## 2021-12-26 DIAGNOSIS — I340 Nonrheumatic mitral (valve) insufficiency: Secondary | ICD-10-CM | POA: Diagnosis not present

## 2021-12-26 DIAGNOSIS — I4811 Longstanding persistent atrial fibrillation: Secondary | ICD-10-CM | POA: Diagnosis not present

## 2021-12-26 DIAGNOSIS — R0789 Other chest pain: Secondary | ICD-10-CM | POA: Diagnosis not present

## 2021-12-26 DIAGNOSIS — I351 Nonrheumatic aortic (valve) insufficiency: Secondary | ICD-10-CM | POA: Diagnosis not present

## 2021-12-26 DIAGNOSIS — I493 Ventricular premature depolarization: Secondary | ICD-10-CM | POA: Diagnosis not present

## 2021-12-26 DIAGNOSIS — I25118 Atherosclerotic heart disease of native coronary artery with other forms of angina pectoris: Secondary | ICD-10-CM | POA: Diagnosis not present

## 2021-12-26 DIAGNOSIS — I495 Sick sinus syndrome: Secondary | ICD-10-CM | POA: Diagnosis not present

## 2021-12-29 DIAGNOSIS — Z6830 Body mass index (BMI) 30.0-30.9, adult: Secondary | ICD-10-CM | POA: Diagnosis not present

## 2021-12-29 DIAGNOSIS — S32020A Wedge compression fracture of second lumbar vertebra, initial encounter for closed fracture: Secondary | ICD-10-CM | POA: Diagnosis not present

## 2022-01-04 ENCOUNTER — Encounter: Payer: Self-pay | Admitting: Gastroenterology

## 2022-01-04 ENCOUNTER — Ambulatory Visit (INDEPENDENT_AMBULATORY_CARE_PROVIDER_SITE_OTHER): Payer: Medicare Other | Admitting: Gastroenterology

## 2022-01-04 VITALS — BP 142/80 | HR 70 | Ht 72.0 in | Wt 221.2 lb

## 2022-01-04 DIAGNOSIS — K219 Gastro-esophageal reflux disease without esophagitis: Secondary | ICD-10-CM | POA: Diagnosis not present

## 2022-01-04 DIAGNOSIS — K8020 Calculus of gallbladder without cholecystitis without obstruction: Secondary | ICD-10-CM

## 2022-01-04 DIAGNOSIS — K581 Irritable bowel syndrome with constipation: Secondary | ICD-10-CM | POA: Diagnosis not present

## 2022-01-04 MED ORDER — OMEPRAZOLE 20 MG PO CPDR: 20.0000 mg | DELAYED_RELEASE_CAPSULE | Freq: Every day | ORAL | 2 refills | Status: DC
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
## 2022-01-04 NOTE — Patient Instructions (Addendum)
_______________________________________________________  If you are age 86 or older, your body mass index should be between 23-30. Your Body mass index is 30.01 kg/m. If this is out of the aforementioned range listed, please consider follow up with your Primary Care Provider.  If you are age 78 or younger, your body mass index should be between 19-25. Your Body mass index is 30.01 kg/m. If this is out of the aformentioned range listed, please consider follow up with your Primary Care Provider.   ________________________________________________________  The [facility] providers would like to encourage you to use [facility] to communicate with providers for non-urgent requests or questions.  Due to long hold times on the telephone, sending your provider a message by [facility] may be a faster and more efficient way to get a response.  Please allow 48 business hours for a response.  Please remember that this is for non-urgent requests.  _______________________________________________________  We have sent the following medications to your pharmacy for you to pick up at your convenience: Omeprazole  Avoid fatty foods.  Continue Senakot 1 at night  If no BM for 3 days, then MOM 15-30 cc p.o. daily.   Call with any questions or concerns.  Thank you,  Dr. Jackquline Denmark

## 2022-01-05 NOTE — Telephone Encounter (Signed)
Pt stated that his PCP has been changed to Dr. Serita Grammes: Changed made in computer system:  Pt verbalized understanding with all questions answered.

## 2022-01-05 NOTE — Telephone Encounter (Signed)
Inbound call from patient wanting to speak with a nurse in regards to yesterday ov. Please advise.  Thank you

## 2022-01-06 DIAGNOSIS — I2510 Atherosclerotic heart disease of native coronary artery without angina pectoris: Secondary | ICD-10-CM | POA: Diagnosis not present

## 2022-01-06 DIAGNOSIS — I4891 Unspecified atrial fibrillation: Secondary | ICD-10-CM | POA: Diagnosis not present

## 2022-01-06 DIAGNOSIS — E1165 Type 2 diabetes mellitus with hyperglycemia: Secondary | ICD-10-CM | POA: Diagnosis not present

## 2022-01-06 DIAGNOSIS — R350 Frequency of micturition: Secondary | ICD-10-CM | POA: Diagnosis not present

## 2022-01-11 ENCOUNTER — Telehealth: Payer: Self-pay | Admitting: Gastroenterology

## 2022-01-11 NOTE — Telephone Encounter (Signed)
Please seen note below. Just FYI

## 2022-01-11 NOTE — Telephone Encounter (Signed)
PT is calling in aqbout omeprazole RX side effects. He has red swollen eyelids and blotches on face. He is very drug sensitive and wants to know what he should do

## 2022-01-11 NOTE — Telephone Encounter (Signed)
Pt stated that he took the Omeprazole on Thursday and Friday morning and on Saturday morning he woke up with his eye lids red, swollen and itching associated  with red blotches on face:  Pt stated that he  has stopped taking the medication. His eyes are better now, no redness or itching: Pt questioned if he wanted for the Dr. To try a different medication: Pt stated that he would hold off on a different medication for now and try Tums and Mylanta which seem to help him

## 2022-01-12 ENCOUNTER — Encounter: Payer: Self-pay | Admitting: Cardiology

## 2022-01-12 ENCOUNTER — Ambulatory Visit: Payer: Medicare Other | Attending: Cardiology | Admitting: Cardiology

## 2022-01-12 VITALS — BP 140/80 | HR 67 | Ht 72.0 in | Wt 222.0 lb

## 2022-01-12 DIAGNOSIS — I4821 Permanent atrial fibrillation: Secondary | ICD-10-CM | POA: Insufficient documentation

## 2022-01-12 DIAGNOSIS — I2510 Atherosclerotic heart disease of native coronary artery without angina pectoris: Secondary | ICD-10-CM | POA: Insufficient documentation

## 2022-01-12 DIAGNOSIS — I25118 Atherosclerotic heart disease of native coronary artery with other forms of angina pectoris: Secondary | ICD-10-CM | POA: Insufficient documentation

## 2022-01-12 DIAGNOSIS — I255 Ischemic cardiomyopathy: Secondary | ICD-10-CM | POA: Insufficient documentation

## 2022-01-12 DIAGNOSIS — I480 Paroxysmal atrial fibrillation: Secondary | ICD-10-CM | POA: Diagnosis not present

## 2022-01-12 DIAGNOSIS — I350 Nonrheumatic aortic (valve) stenosis: Secondary | ICD-10-CM | POA: Insufficient documentation

## 2022-01-12 NOTE — Patient Instructions (Signed)
Medication Instructions:   Ecotrin 1 daily   Lab Work: None Ordered If you have labs (blood work) drawn today and your tests are completely normal, you will receive your results only by: MyChart Message (if you have MyChart) OR A paper copy in the mail If you have any lab test that is abnormal or we need to change your treatment, we will call you to review the results.   Testing/Procedures: None Ordered   Follow-Up: At [facility], you and your health needs are our priority.  As part of our continuing mission to provide you with exceptional heart care, we have created designated Provider Care Teams.  These Care Teams include your primary Cardiologist (physician) and Advanced Practice Providers (APPs -  Physician Assistants and Nurse Practitioners) who all work together to provide you with the care you need, when you need it.  We recommend signing up for the patient portal called "MyChart".  Sign up information is provided on this After Visit Summary.  MyChart is used to connect with patients for Virtual Visits (Telemedicine).  Patients are able to view lab/test results, encounter notes, upcoming appointments, etc.  Non-urgent messages can be sent to your provider as well.   To learn more about what you can do with MyChart, go to [email or web address].    Your next appointment:   3 month(s)  The format for your next appointment:   In Person  Provider:   Jenne Campus, MD    Other Instructions NA

## 2022-01-12 NOTE — Progress Notes (Signed)
Cardiology Office Note:    Date:  01/12/2022   ID:  Keith Mccall, DOB [birth year], MRN [id number]  PCP:  Serita Grammes, MD  Cardiologist:  Jenne Campus, MD    Referring MD: Ernestene Kiel, MD   Chief Complaint  Patient presents with   Annual Exam    History of Present Illness:    Keith Mccall is a 88 y.o. male with challenging past medical history.  He did have cardiac catheterization done in the summer of this year he was fine multiple vessel disease with 50% left main, 90% diagonal, 90% LAD, 90% RCA however he was seen by surgeon felt not to be candidate for any surgical intervention on top of that patient clearly does not want to have any intervention.  Additional problem include essential hypertension, dyslipidemia, aortic stenosis which is moderate based on last echocardiogram done in August 2023, he does have cardiomyopathy which probably ischemic in origin ejection fraction 45-50 again based on echocardiogram from summer 2023.  He had difficulty tolerating medication he takes only some herbal medication except he is able to tolerate metoprolol.  Overall he said he is doing fair.  He described to have some fatigue tiredness and shortness of breath sometimes chest pain.  He takes some herbal supplementation which seems to be helping with the pain.  He is afraid to take nitroglycerin because of potential side effects.  We had a long discussion about the situation.  I was able to convince him to start taking 1 baby aspirin every single day.  We did talk about potential alternatives to this in form of Plavix however he prefers to try nitroglycerin.  His ability to access sites is limited because of back problem.  He spent majority of time sitting in the chair.  Past Medical History:  Diagnosis Date   Anxiety    Atrial fibrillation ([place])    Chest pain in adult 12/07/2017   Colon polyps    Comprehensive diabetic foot examination, type 2 DM, encounter for ([place]) 04/10/2019    Compression fracture of C-spine ([place])    Contusion of right great toe without damage to nail 04/10/2019   Diabetic polyneuropathy associated with type 2 diabetes mellitus ([place]) 04/10/2019   Essential hypertension 12/07/2017   Hallux hammertoe, right 04/10/2019   IBS (irritable bowel syndrome)    Mixed hyperlipidemia 12/07/2017   Nonrheumatic aortic valve insufficiency 12/07/2017   Nonrheumatic mitral valve regurgitation 12/07/2017   Paroxysmal A-fib ([place]) 12/05/2017   PVC (premature ventricular contraction) 12/05/2017   Sick sinus syndrome ([place]) 12/07/2017   Spinal stenosis    Tinnitus     Past Surgical History:  Procedure Laterality Date   CATARACT EXTRACTION Bilateral 2017   COLONOSCOPY     age 40 Dr Jerilynn Mages, had every 5 years   HERNIA REPAIR      Current Medications: Current Meds  Medication Sig   acetaminophen (TYLENOL) 500 MG tablet Take 1,000 mg by mouth every 6 (six) hours as needed for mild pain. Rapid release   ALPRAZolam (XANAX) 1 MG tablet Take 1 mg by mouth 2 (two) times daily as needed for anxiety.   co-enzyme Q-10 30 MG capsule Take 30 mg by mouth 3 (three) times daily.   COD LIVER OIL PO Take 2 tablets by mouth every morning.   Elderberry 575 MG/5ML SYRP Take 5 mLs by mouth daily. Extract   Hawthorne Berry 500 MG CAPS Take 500 mg by mouth daily.   metoprolol tartrate (LOPRESSOR) 25 MG tablet Take  12.5 mg by mouth 2 (two) times daily.   OVER THE COUNTER MEDICATION Take 2 tablets by mouth 3 (three) times daily with meals. Diaplex   OVER THE COUNTER MEDICATION Take 1 tablet by mouth 2 (two) times daily with a meal. pancreatrophin pmg   OVER THE COUNTER MEDICATION Take 1 tablet by mouth 3 (three) times daily with meals. livaplex   OVER THE COUNTER MEDICATION Take 1-5 tablets by mouth See admin instructions. cataplex e2  Daily and need for tightness in chest or shortness of breath can chew up to 5 tables   Probiotic Product (PROBIOTIC PO) Take 1 tablet by mouth daily. 50  Billion   Taurine 1000 MG CAPS Take 1,000 mg by mouth daily.   Ubiquinol 50 MG CAPS Take 50 mg by mouth daily.     Allergies:   Amiodarone, Erythromycin base, Omeprazole, Polycarbophil, Penicillins, and Sulfa antibiotics   Social History   Socioeconomic History   Marital status: Married    Spouse name: Not on file   Number of children: 3   Years of education: Not on file   Highest education level: Not on file  Occupational History   Occupation: retired  Tobacco Use   Smoking status: Never   Smokeless tobacco: Never  Vaping Use   Vaping Use: Never used  Substance and Sexual Activity   Alcohol use: No   Drug use: No   Sexual activity: Not on file  Other Topics Concern   Not on file  Social History Narrative   Not on file   Social Determinants of Health   Financial Resource Strain: Not on file  Food Insecurity: Not on file  Transportation Needs: Not on file  Physical Activity: Not on file  Stress: Not on file  Social Connections: Not on file     Family History: The patient's family history includes Breast cancer in his sister; CAD in his father; Coronary artery disease in his father; Diabetes in his maternal grandmother, mother, and sister; GI Bleed in his sister; Heart attack in his mother; Heart disease in his mother; Hyperlipidemia in his sister; Hypertension in his father; Lung cancer in his mother; Migraines in his child; Stroke in his father. There is no history of Colon cancer, Rectal cancer, or Recurrent abdominal pain. ROS:   Please see the history of present illness.    All 14 point review of systems negative except as described per history of present illness  EKGs/Labs/Other Studies Reviewed:      Recent Labs: No results found for requested labs within last 365 days.  Recent Lipid Panel No results found for: "CHOL", "TRIG", "HDL", "CHOLHDL", "VLDL", "LDLCALC", "LDLDIRECT"  Physical Exam:    VS:  BP (!) 140/80 (BP Location: Left Arm, Patient Position:  Sitting, Cuff Size: Normal)   Pulse 67   Ht 6' (1.829 m)   Wt 222 lb (100.7 kg)   SpO2 98%   BMI 30.11 kg/m     Wt Readings from Last 3 Encounters:  01/12/22 222 lb (100.7 kg)  01/04/22 221 lb 4 oz (100.4 kg)  09/06/21 222 lb (100.7 kg)     GEN:  Well nourished, well developed in no acute distress HEENT: Normal NECK: No JVD; No carotid bruits LYMPHATICS: No lymphadenopathy CARDIAC: RRR, no murmurs, no rubs, no gallops RESPIRATORY:  Clear to auscultation without rales, wheezing or rhonchi  ABDOMEN: Soft, non-tender, non-distended MUSCULOSKELETAL:  No edema; No deformity  SKIN: Warm and dry LOWER EXTREMITIES: no swelling NEUROLOGIC:  Alert and  oriented x 3 PSYCHIATRIC:  Normal affect   ASSESSMENT:    1. Paroxysmal A-fib ([place])   2. Coronary artery disease of native artery of native heart with stable angina pectoris ([place])   3. Ischemic cardiomyopathy ejection fraction 45 to 50% echocardiogram from August 2023   4. Nonrheumatic aortic valve stenosis   5. Permanent atrial fibrillation (HCC)    PLAN:    In order of problems listed above:  Permanent atrial fibrillation.  He does not want to take any anticoagulation.  He takes some herbal stuff as well as some all facial oil and I think this is enough I told him this is absolutely not enough in terms of preventing from having stroke but he does not want to change anything right now.  I think we need to approach the situation step-by-step.  At least today I was able to convince him to start taking baby aspirin every single night.  I think I will continue the discussion with him and hopefully eventually be able to convince him to take some more appropriate medication for his very complex scenario. Coronary disease with a angina pectoris.  He does not want to take nitroglycerin.  He is very happy with beta-blocker.  He said episodes are rare and he lives more or less sedentary lifestyle and he is happy where he is. Nonrheumatic aortic  valve stenosis look like moderate so far no need to intervene but if we really need to intervene I do not think he would like this. Ischemic cardiomyopathy with diminished ejection fraction 45 to 50%.  Ideally he need to be on Entresto, beta-blocker he is already however he does not want to take any medication because he is scared of side effects.   Medication Adjustments/Labs and Tests Ordered: Current medicines are reviewed at length with the patient today.  Concerns regarding medicines are outlined above.  Orders Placed This Encounter  Procedures   EKG 12-Lead   Medication changes: No orders of the defined types were placed in this encounter.   Signed, Park Liter, MD, [facility] 01/12/2022 4:38 PM    [place]

## 2022-01-20 NOTE — Telephone Encounter (Signed)
Stop omeprazole. Can try pantoprazole 40 mg p.o. daily #30 RG

## 2022-01-20 NOTE — Telephone Encounter (Signed)
Pt made ware of Dr. Lyndel Safe recommendations: Pt stated that he has not been having an issue with reflux lately and that he is staying away from fried foods. Pt stated that he would like to hold off on the prescription for now and would call us back if he thinks he needs it.  Pt verbalized understanding with all questions answered.

## 2022-01-31 ENCOUNTER — Other Ambulatory Visit: Payer: Self-pay | Admitting: Gastroenterology

## 2022-01-31 ENCOUNTER — Other Ambulatory Visit: Payer: Self-pay

## 2022-01-31 DIAGNOSIS — K219 Gastro-esophageal reflux disease without esophagitis: Secondary | ICD-10-CM

## 2022-01-31 MED ORDER — PANTOPRAZOLE SODIUM 40 MG PO TBEC: 40.0000 mg | DELAYED_RELEASE_TABLET | Freq: Every day | ORAL | 0 refills | Status: DC

## 2022-01-31 NOTE — Telephone Encounter (Signed)
Pt stated that he would like to try the  Pantoprazole prescription that Dr. Lyndel Safe had recommended prior:  Prescription sent to pharmacy: Pt made aware: Pt verbalized understanding with all questions answered.

## 2022-01-31 NOTE — Telephone Encounter (Signed)
Patient calling to f/u on omeprazole medication. Please advise.

## 2022-02-08 DIAGNOSIS — N390 Urinary tract infection, site not specified: Secondary | ICD-10-CM | POA: Diagnosis not present

## 2022-02-08 DIAGNOSIS — R351 Nocturia: Secondary | ICD-10-CM | POA: Diagnosis not present

## 2022-02-08 DIAGNOSIS — N401 Enlarged prostate with lower urinary tract symptoms: Secondary | ICD-10-CM | POA: Diagnosis not present

## 2022-03-10 DIAGNOSIS — N390 Urinary tract infection, site not specified: Secondary | ICD-10-CM | POA: Diagnosis not present

## 2022-03-14 DIAGNOSIS — I4891 Unspecified atrial fibrillation: Secondary | ICD-10-CM | POA: Diagnosis not present

## 2022-03-14 DIAGNOSIS — I255 Ischemic cardiomyopathy: Secondary | ICD-10-CM | POA: Diagnosis not present

## 2022-03-14 DIAGNOSIS — E1165 Type 2 diabetes mellitus with hyperglycemia: Secondary | ICD-10-CM | POA: Diagnosis not present

## 2022-03-14 DIAGNOSIS — Z79899 Other long term (current) drug therapy: Secondary | ICD-10-CM | POA: Diagnosis not present

## 2022-03-14 DIAGNOSIS — D510 Vitamin B12 deficiency anemia due to intrinsic factor deficiency: Secondary | ICD-10-CM | POA: Diagnosis not present

## 2022-03-15 ENCOUNTER — Telehealth: Payer: Self-pay | Admitting: Cardiology

## 2022-03-15 NOTE — Telephone Encounter (Signed)
Pt c/o medication issue:  1. Name of Medication: Entresto  2. How are you currently taking this medication (dosage and times per day)?   3. Are you having a reaction (difficulty breathing--STAT)?   4. What is your medication issue? Patient called stating he saw Dr. Jeryl Columbia yesterday and she prescribed him Entresto.  He wants to know what that medication is before he goes to the pharmacy and picks it up.

## 2022-03-15 NOTE — Telephone Encounter (Signed)
Spoke with pt. Dr. Agustin Cree had recommended [facility] for the pt 01-12-22. Pt had declined. He saw PCP Dr. Jeryl Columbia yesterday and she recommended that he start the [facility] and sent it to the pharmacy. Pt called with questions regarding what Delene Loll is prescribed for. Discussed with pt Dr. Wendy Poet note of 01-12-22. Pt agreed and stated that he would start the medication. He verbalized understanding and had no further questions.

## 2022-03-17 ENCOUNTER — Telehealth: Payer: Self-pay | Admitting: Cardiology

## 2022-03-17 NOTE — Telephone Encounter (Signed)
Spoke with Rollene Fare per DPR who states that pt was concerned with his low heart rate last pm as Dr. Laqueta Due had advised him in the past to hold his Lopressor if his heart rate was 50 or less. Pt did hold his dose last pm. Pt reported that he did not feel bad or have any sx at the time he checked it. Pt stared taking his Delene Loll that was prescribed back in December yesterday. Advised to check his BP/HR twice daily after the [facility] and let us know next week the readings. Rollene Fare verbalized understanding and had no additional questions.  Please advise on guidelines for when to hold his Lopressor based on BP and HR.

## 2022-03-17 NOTE — Telephone Encounter (Signed)
STAT if HR is under 50 or over 120 (normal HR is 60-100 beats per minute)  What is your heart rate? 74  Do you have a log of your heart rate readings (document readings)? 48 last night, 59, 53, 55  Do you have any other symptoms? No   Pt states since starting Entresto yesterday, HR has been low. Pt would like a callback regarding this matter. Please advise

## 2022-03-20 ENCOUNTER — Telehealth: Payer: Self-pay | Admitting: Cardiology

## 2022-03-20 NOTE — Telephone Encounter (Signed)
Patient called to give BP readings:  3/2  132/77 64  119/80 62 3/3 110/70 69  123/68 70  Patient wants to know if each dose of  Entresto should be taken certain amount of hours apart. He has been getting a little anxious and shaky in the morning then he takes his xanax and it goes away, he is wondering if the Delene Loll has anything to do with it.

## 2022-03-20 NOTE — Telephone Encounter (Signed)
Patient's wife stated that the patient's heart rate last Friday was in the 45's and she was advise to record the patients heart rate and blood pressures and report back on Monday. The vitals are listed below:  3/2 132/77 HR 64  119/80 HR 62 3/3 110/70 HR 69  123/68 HR 70  Explained to the patient that medications that are twice daily should be taken at least 12 hours apart. She was also asking if the Delene Loll would make the patient feel shaky in the morning?

## 2022-03-20 NOTE — Telephone Encounter (Signed)
Left message for the patient to call back.

## 2022-03-20 NOTE — Telephone Encounter (Signed)
Patient's wife is returning call. 

## 2022-03-22 ENCOUNTER — Telehealth: Payer: Self-pay | Admitting: Gastroenterology

## 2022-03-22 NOTE — Telephone Encounter (Signed)
Called patient and informed him of Dr. Wendy Poet recommendation below:  "Blood pressure is okay, yes Delene Loll can give some shakiness in the morning but it is critically essential medication therefore I prefer to continue"  Patient was agreeable with continuing to take the medication and he had no further questions at this time.

## 2022-03-22 NOTE — Telephone Encounter (Signed)
Inbound call from patient,received a medication Preparation H. Patient states the box states to speak to a provider, if he has diabetes or heart disease. Please advise.

## 2022-03-23 DIAGNOSIS — H1011 Acute atopic conjunctivitis, right eye: Secondary | ICD-10-CM | POA: Diagnosis not present

## 2022-03-23 DIAGNOSIS — E119 Type 2 diabetes mellitus without complications: Secondary | ICD-10-CM | POA: Diagnosis not present

## 2022-03-23 DIAGNOSIS — Z961 Presence of intraocular lens: Secondary | ICD-10-CM | POA: Diagnosis not present

## 2022-03-23 NOTE — Telephone Encounter (Signed)
Pt stated that he was wanting to treat a hemorrhoid with the OTC preparations H. Pt stated that on the box it stated If you have any of the following health problems, consult your doctor or pharmacist before using this product: diabetes, heart problems: Pt stated that he does have diabetes and Cardiac problems ( 3 blocked arteries) Pt questioning if Ok to proceed with using the Preparation H. Please Advise.

## 2022-03-23 NOTE — Telephone Encounter (Signed)
Pt made aware of Dr. Gupta recommendations: Pt verbalized understanding with all questions answered.   

## 2022-03-23 NOTE — Telephone Encounter (Signed)
Okay to use Preparation H. RG

## 2022-03-29 ENCOUNTER — Ambulatory Visit: Payer: Medicare Other | Admitting: Gastroenterology

## 2022-03-30 DIAGNOSIS — N390 Urinary tract infection, site not specified: Secondary | ICD-10-CM | POA: Diagnosis not present

## 2022-04-11 DIAGNOSIS — L821 Other seborrheic keratosis: Secondary | ICD-10-CM | POA: Diagnosis not present

## 2022-04-11 DIAGNOSIS — I255 Ischemic cardiomyopathy: Secondary | ICD-10-CM | POA: Diagnosis not present

## 2022-04-11 DIAGNOSIS — L570 Actinic keratosis: Secondary | ICD-10-CM | POA: Diagnosis not present

## 2022-04-11 DIAGNOSIS — L578 Other skin changes due to chronic exposure to nonionizing radiation: Secondary | ICD-10-CM | POA: Diagnosis not present

## 2022-04-11 DIAGNOSIS — I509 Heart failure, unspecified: Secondary | ICD-10-CM | POA: Diagnosis not present

## 2022-04-11 DIAGNOSIS — E1165 Type 2 diabetes mellitus with hyperglycemia: Secondary | ICD-10-CM | POA: Diagnosis not present

## 2022-04-11 DIAGNOSIS — C44311 Basal cell carcinoma of skin of nose: Secondary | ICD-10-CM | POA: Diagnosis not present

## 2022-04-11 DIAGNOSIS — I4891 Unspecified atrial fibrillation: Secondary | ICD-10-CM | POA: Diagnosis not present

## 2022-04-13 DIAGNOSIS — M47816 Spondylosis without myelopathy or radiculopathy, lumbar region: Secondary | ICD-10-CM | POA: Diagnosis not present

## 2022-04-21 ENCOUNTER — Encounter: Payer: Self-pay | Admitting: Cardiology

## 2022-04-21 ENCOUNTER — Ambulatory Visit: Payer: Medicare Other | Attending: Cardiology | Admitting: Cardiology

## 2022-04-21 VITALS — BP 140/78 | HR 58 | Ht 71.0 in | Wt 224.4 lb

## 2022-04-21 DIAGNOSIS — R0609 Other forms of dyspnea: Secondary | ICD-10-CM | POA: Insufficient documentation

## 2022-04-21 DIAGNOSIS — K219 Gastro-esophageal reflux disease without esophagitis: Secondary | ICD-10-CM | POA: Diagnosis not present

## 2022-04-21 DIAGNOSIS — I25118 Atherosclerotic heart disease of native coronary artery with other forms of angina pectoris: Secondary | ICD-10-CM | POA: Diagnosis not present

## 2022-04-21 DIAGNOSIS — I4821 Permanent atrial fibrillation: Secondary | ICD-10-CM

## 2022-04-21 DIAGNOSIS — I255 Ischemic cardiomyopathy: Secondary | ICD-10-CM | POA: Diagnosis not present

## 2022-04-21 DIAGNOSIS — I350 Nonrheumatic aortic (valve) stenosis: Secondary | ICD-10-CM | POA: Diagnosis not present

## 2022-04-21 NOTE — Patient Instructions (Addendum)
Medication Instructions:  Your physician recommends that you continue on your current medications as directed. Please refer to the Current Medication list given to you today.  *If you need a refill on your cardiac medications before your next appointment, please call your pharmacy*   Lab Work: None Ordered If you have labs (blood work) drawn today and your tests are completely normal, you will receive your results only by: MyChart Message (if you have MyChart) OR A paper copy in the mail If you have any lab test that is abnormal or we need to change your treatment, we will call you to review the results.   Testing/Procedures: Your physician has requested that you have an echocardiogram. Echocardiography is a painless test that uses sound waves to create images of your heart. It provides your doctor with information about the size and shape of your heart and how well your heart's chambers and valves are working. This procedure takes approximately one hour. There are no restrictions for this procedure. Please do NOT wear cologne, perfume, aftershave, or lotions (deodorant is allowed). Please arrive 15 minutes prior to your appointment time.    Follow-Up: At [facility], you and your health needs are our priority.  As part of our continuing mission to provide you with exceptional heart care, we have created designated Provider Care Teams.  These Care Teams include your primary Cardiologist (physician) and Advanced Practice Providers (APPs -  Physician Assistants and Nurse Practitioners) who all work together to provide you with the care you need, when you need it.  We recommend signing up for the patient portal called "MyChart".  Sign up information is provided on this After Visit Summary.  MyChart is used to connect with patients for Virtual Visits (Telemedicine).  Patients are able to view lab/test results, encounter notes, upcoming appointments, etc.  Non-urgent messages can be sent to your  provider as well.   To learn more about what you can do with MyChart, go to [email or web address].    Your next appointment:   3 month(s)  The format for your next appointment:   In Person  Provider:   Robert Krasowski, MD    Other Instructions NA  

## 2022-04-21 NOTE — Progress Notes (Signed)
Cardiology Office Note:    Date:  04/21/2022   ID:  Keith Mccall, DOB [birth year], MRN [id number]  PCP:  Buckner MaltaBurgart, Jennifer, MD  Cardiologist:  Gypsy Balsamobert Tannie Koskela, MD    Referring MD: Buckner MaltaBurgart, Jennifer, MD   Chief Complaint  Patient presents with   Low HR   Gastroesophageal Reflux   Chest Pain    Can determine wether this is GERD or a true heart issue    History of Present Illness:    Keith BoJohn A Decamp is a 89 y.o. male  with challenging past medical history. He did have cardiac catheterization done in the summer of this year he was fine multiple vessel disease with 50% left main, 90% diagonal, 90% LAD, 90% RCA however he was seen by surgeon felt not to be candidate for any surgical intervention on top of that patient clearly does not want to have any intervention. Additional problem include essential hypertension, dyslipidemia, aortic stenosis which is moderate based on last echocardiogram done in August 2023, he does have cardiomyopathy which probably ischemic in origin ejection fraction 45-50 again based on echocardiogram from summer 2023. He had difficulty tolerating medication he takes only some herbal medication except he is able to tolerate metoprolol. Overall he said he is doing fair. He described to have some fatigue tiredness and shortness of breath sometimes chest pain. He takes some herbal supplementation which seems to be helping with the pain. He is afraid to take nitroglycerin because of potential side effects.  Comes today to my office for follow-up and actually states that he is doing better.  He decided to start taking Entresto with good response he is feeling better and stronger still described to have fairly typical angina pectoris.  He can walk couple steps and he developed this he takes usually apple cider vinegar with good relief for it.  He is afraid to take nitroglycerin he is afraid of side effect but gradually it looks like he start getting open for suggestion of taking some  medications.  We decided today to do echocardiogram to reassess degree of aortic stenosis obviously he would not be candidate for surgery he was disqualified her ready for surgery for his coronary arteries however understanding if he is aortic valve is critical is essential for putting him on an antianginal medications.  Past Medical History:  Diagnosis Date   Anxiety    Atrial fibrillation    Chest pain in adult 12/07/2017   Colon polyps    Comprehensive diabetic foot examination, type 2 DM, encounter for 04/10/2019   Compression fracture of C-spine    Contusion of right great toe without damage to nail 04/10/2019   Diabetic polyneuropathy associated with type 2 diabetes mellitus 04/10/2019   Essential hypertension 12/07/2017   Hallux hammertoe, right 04/10/2019   IBS (irritable bowel syndrome)    Mixed hyperlipidemia 12/07/2017   Nonrheumatic aortic valve insufficiency 12/07/2017   Nonrheumatic mitral valve regurgitation 12/07/2017   Paroxysmal A-fib 12/05/2017   PVC (premature ventricular contraction) 12/05/2017   Sick sinus syndrome 12/07/2017   Spinal stenosis    Tinnitus     Past Surgical History:  Procedure Laterality Date   CATARACT EXTRACTION Bilateral 2017   COLONOSCOPY     age 89 Dr Judie PetitM, had every 5 years   HERNIA REPAIR      Current Medications: Current Meds  Medication Sig   acetaminophen (TYLENOL) 500 MG tablet Take 1,000 mg by mouth every 6 (six) hours as needed for mild pain. Rapid release  ALPRAZolam (XANAX) 1 MG tablet Take 1 mg by mouth 2 (two) times daily as needed for anxiety.   co-enzyme Q-10 30 MG capsule Take 30 mg by mouth 3 (three) times daily.   COD LIVER OIL PO Take 2 tablets by mouth every morning.   Elderberry 575 MG/5ML SYRP Take 5 mLs by mouth daily. Extract   Hawthorne Berry 500 MG CAPS Take 500 mg by mouth daily.   metoprolol tartrate (LOPRESSOR) 25 MG tablet Take 12.5 mg by mouth 2 (two) times daily.   OVER THE COUNTER MEDICATION Take 2  tablets by mouth 3 (three) times daily with meals. Diaplex   OVER THE COUNTER MEDICATION Take 1 tablet by mouth 2 (two) times daily with a meal. pancreatrophin pmg   OVER THE COUNTER MEDICATION Take 1 tablet by mouth 3 (three) times daily with meals. livaplex   OVER THE COUNTER MEDICATION Take 1-5 tablets by mouth See admin instructions. cataplex e2  Daily and need for tightness in chest or shortness of breath can chew up to 5 tables   pantoprazole (PROTONIX) 40 MG tablet TAKE 1 TABLET(40 MG) BY MOUTH DAILY (Patient taking differently: Take 40 mg by mouth daily.)   Probiotic Product (PROBIOTIC PO) Take 1 tablet by mouth daily. 50 Billion   sacubitril-valsartan (ENTRESTO) 24-26 MG Take 1 tablet by mouth 2 (two) times daily.   Taurine 1000 MG CAPS Take 1,000 mg by mouth daily.   Ubiquinol 50 MG CAPS Take 50 mg by mouth daily.     Allergies:   Amiodarone, Erythromycin base, Omeprazole, Polycarbophil, Penicillins, and Sulfa antibiotics   Social History   Socioeconomic History   Marital status: Married    Spouse name: Not on file   Number of children: 3   Years of education: Not on file   Highest education level: Not on file  Occupational History   Occupation: retired  Tobacco Use   Smoking status: Never   Smokeless tobacco: Never  Vaping Use   Vaping Use: Never used  Substance and Sexual Activity   Alcohol use: No   Drug use: No   Sexual activity: Not on file  Other Topics Concern   Not on file  Social History Narrative   Not on file   Social Determinants of Health   Financial Resource Strain: Not on file  Food Insecurity: Not on file  Transportation Needs: Not on file  Physical Activity: Not on file  Stress: Not on file  Social Connections: Not on file     Family History: The patient's family history includes Breast cancer in his sister; CAD in his father; Coronary artery disease in his father; Diabetes in his maternal grandmother, mother, and sister; GI Bleed in his  sister; Heart attack in his mother; Heart disease in his mother; Hyperlipidemia in his sister; Hypertension in his father; Lung cancer in his mother; Migraines in his child; Stroke in his father. There is no history of Colon cancer, Rectal cancer, or Recurrent abdominal pain. ROS:   Please see the history of present illness.    All 14 point review of systems negative except as described per history of present illness  EKGs/Labs/Other Studies Reviewed:      Recent Labs: No results found for requested labs within last 365 days.  Recent Lipid Panel No results found for: "CHOL", "TRIG", "HDL", "CHOLHDL", "VLDL", "LDLCALC", "LDLDIRECT"  Physical Exam:    VS:  BP (!) 140/78 (BP Location: Left Arm, Patient Position: Sitting)   Pulse (!) 58  Ht 5\' 11"  (1.803 m)   Wt 224 lb 6.4 oz (101.8 kg)   SpO2 97%   BMI 31.30 kg/m     Wt Readings from Last 3 Encounters:  04/21/22 224 lb 6.4 oz (101.8 kg)  01/12/22 222 lb (100.7 kg)  01/04/22 221 lb 4 oz (100.4 kg)     GEN:  Well nourished, well developed in no acute distress HEENT: Normal NECK: No JVD; No carotid bruits LYMPHATICS: No lymphadenopathy CARDIAC: RRR, no murmurs, no rubs, no gallops RESPIRATORY:  Clear to auscultation without rales, wheezing or rhonchi  ABDOMEN: Soft, non-tender, non-distended MUSCULOSKELETAL:  No edema; No deformity  SKIN: Warm and dry LOWER EXTREMITIES: no swelling NEUROLOGIC:  Alert and oriented x 3 PSYCHIATRIC:  Normal affect   ASSESSMENT:    1. Gastroesophageal reflux disease without esophagitis   2. Coronary artery disease of native artery of native heart with stable angina pectoris   3. Nonrheumatic aortic valve stenosis   4. Ischemic cardiomyopathy ejection fraction 45 to 50% echocardiogram from August 2023   5. Permanent atrial fibrillation    PLAN:    In order of problems listed above:  Gastroesophageal reflux disease however the symptomatology described look more like typical angina  pectoris.  He get this with exercise he does have some stationary bike that he try to right he said he can do it for about 3 minutes and 4 minute he started getting tightness in the chest have to slow down.  He does not want to take nitroglycerin because he is afraid of side effect of this medication. Coronary disease.  Will do echocardiogram to assess degree of aortic stenosis, if artery stenosis not critical probably start him on ranolazine. Known rheumatic aortic valve stenosis again echocardiogram will be done. Dyslipidemia I did review K PN LDL 97 HDL 39.  He does not want to take any medications for it.   Overall he is a very challenging individual but gradually I am trying to convince him to start taking appropriate medications it look like you are making small progress.   Medication Adjustments/Labs and Tests Ordered: Current medicines are reviewed at length with the patient today.  Concerns regarding medicines are outlined above.  No orders of the defined types were placed in this encounter.  Medication changes: No orders of the defined types were placed in this encounter.   Signed, Georgeanna Leaobert J. Cristina Mattern, MD, [facility] 04/21/2022 3:01 PM    [facility]

## 2022-04-21 NOTE — Addendum Note (Signed)
Addended by: Baldo Ash D on: 04/21/2022 03:06 PM   Modules accepted: Orders

## 2022-04-22 LAB — BASIC METABOLIC PANEL
BUN/Creatinine Ratio: 25 — ABNORMAL HIGH (ref 10–24)
BUN: 18 mg/dL (ref 8–27)
CO2: 23 mmol/L (ref 20–29)
Calcium: 8.9 mg/dL (ref 8.6–10.2)
Chloride: 102 mmol/L (ref 96–106)
Creatinine, Ser: 0.73 mg/dL — ABNORMAL LOW (ref 0.76–1.27)
Glucose: 104 mg/dL — ABNORMAL HIGH (ref 70–99)
Potassium: 4.1 mmol/L (ref 3.5–5.2)
Sodium: 141 mmol/L (ref 134–144)
eGFR: 87 mL/min/{1.73_m2} (ref >59)

## 2022-04-24 DIAGNOSIS — L219 Seborrheic dermatitis, unspecified: Secondary | ICD-10-CM | POA: Diagnosis not present

## 2022-04-24 DIAGNOSIS — L570 Actinic keratosis: Secondary | ICD-10-CM | POA: Diagnosis not present

## 2022-04-26 ENCOUNTER — Telehealth: Payer: Self-pay

## 2022-04-26 NOTE — Telephone Encounter (Signed)
-----   Message from Georgeanna Lea, MD sent at 04/26/2022  1:47 PM EDT ----- Chem-7 looks good, continue present management

## 2022-04-26 NOTE — Telephone Encounter (Signed)
Patient notified through my chart.

## 2022-05-05 ENCOUNTER — Ambulatory Visit: Payer: Medicare Other | Attending: Cardiology

## 2022-05-05 DIAGNOSIS — R0609 Other forms of dyspnea: Secondary | ICD-10-CM | POA: Diagnosis not present

## 2022-05-05 DIAGNOSIS — I350 Nonrheumatic aortic (valve) stenosis: Secondary | ICD-10-CM | POA: Insufficient documentation

## 2022-05-05 DIAGNOSIS — I255 Ischemic cardiomyopathy: Secondary | ICD-10-CM | POA: Diagnosis not present

## 2022-05-05 LAB — ECHOCARDIOGRAM COMPLETE
AR max vel: 1.54 cm2
AV Area VTI: 1.49 cm2
AV Area mean vel: 1.51 cm2
AV Mean grad: 11.6 mmHg
AV Peak grad: 21.1 mmHg
Ao pk vel: 2.3 m/s
Area-P 1/2: 4.21 cm2
MV M vel: 5.24 m/s
MV Peak grad: 109.8 mmHg
S' Lateral: 3.45 cm

## 2022-05-09 ENCOUNTER — Telehealth: Payer: Self-pay | Admitting: Cardiology

## 2022-05-09 ENCOUNTER — Ambulatory Visit: Payer: Medicare Other | Admitting: Gastroenterology

## 2022-05-09 NOTE — Telephone Encounter (Signed)
Pt c/o medication issue:  1. Name of Medication: sacubitril-valsartan (ENTRESTO) 24-26 MG   2. How are you currently taking this medication (dosage and times per day)? As prescribed  3. Are you having a reaction (difficulty breathing--STAT)? Yes  4. What is your medication issue? Patient has been having diarrhea that is sometimes the consistency of mud and other times like water. He is wanting to know if this could be due to this medication. Patient reports he does have IBS, but this just started two weeks ago. Please advise.

## 2022-05-09 NOTE — Telephone Encounter (Signed)
Spoke with pt and spouse. Pt stated that he was having diarrhea and wondered if it was the [place]. Sent question to [employer]. He stated that it was possible but unlikely. Advised pt to stay well hydrated and follow up with his PCP for any further diarrhea. Pt agreed ands had no further questions.

## 2022-05-09 NOTE — Telephone Encounter (Signed)
It can, as can many other medications, and he has been on Entresto since early March so likely Sherryll Burger is not the cause. Recommend contacting PCP office

## 2022-05-10 ENCOUNTER — Telehealth: Payer: Self-pay | Admitting: Gastroenterology

## 2022-05-10 NOTE — Telephone Encounter (Signed)
Pt stated that his appointment got canceled yesterday witChales AbrahamsDr. Gupta. ( Our office Canceled)  Pt stated that he is still having diarrhea with incontinence  Pt was rescheduled for an office visit to see Jarrett Soho at 1:30 PM. Pt made aware.  Pt verbalized understanding with all questions answered.

## 2022-05-10 NOTE — Telephone Encounter (Signed)
Requesting call back. Having severe diarrhea and looking for options for relief. Please advise.

## 2022-05-10 NOTE — Telephone Encounter (Signed)
Inbound call from patient, following up on message below, patient states he had an appointment yesterday but was canceled. Patient was then rescheduled for July but states his symptoms have not gotten any better. Anxiously waiting on a nurse to return his call.

## 2022-05-11 ENCOUNTER — Telehealth: Payer: Self-pay | Admitting: Cardiology

## 2022-05-11 ENCOUNTER — Telehealth: Payer: Self-pay

## 2022-05-11 NOTE — Telephone Encounter (Signed)
Results reviewed with pt as per Dr. Krasowski's note.  Pt verbalized understanding and had no additional questions. Routed to PCP  

## 2022-05-11 NOTE — Telephone Encounter (Signed)
° ° °  Pt is calling to get echo result °

## 2022-05-12 ENCOUNTER — Telehealth: Payer: Self-pay | Admitting: Cardiology

## 2022-05-12 ENCOUNTER — Other Ambulatory Visit: Payer: Self-pay

## 2022-05-12 NOTE — Telephone Encounter (Signed)
Patient informed of result.

## 2022-05-12 NOTE — Telephone Encounter (Signed)
Pt c/o medication issue:  1. Name of Medication: Potassium   2. How are you currently taking this medication (dosage and times per day)?   3. Are you having a reaction (difficulty breathing--STAT)?   4. What is your medication issue? Patient was told he would need to start taking this medication, but would like to know if eating a banana a day will work. Would also like to speak further on echo results. Please advise.

## 2022-05-18 DIAGNOSIS — N390 Urinary tract infection, site not specified: Secondary | ICD-10-CM | POA: Diagnosis not present

## 2022-05-30 DIAGNOSIS — E785 Hyperlipidemia, unspecified: Secondary | ICD-10-CM | POA: Diagnosis not present

## 2022-05-30 DIAGNOSIS — I4891 Unspecified atrial fibrillation: Secondary | ICD-10-CM | POA: Diagnosis not present

## 2022-05-30 DIAGNOSIS — I509 Heart failure, unspecified: Secondary | ICD-10-CM | POA: Diagnosis not present

## 2022-05-30 DIAGNOSIS — Z0000 Encounter for general adult medical examination without abnormal findings: Secondary | ICD-10-CM | POA: Diagnosis not present

## 2022-05-30 DIAGNOSIS — Z79899 Other long term (current) drug therapy: Secondary | ICD-10-CM | POA: Diagnosis not present

## 2022-05-30 DIAGNOSIS — F324 Major depressive disorder, single episode, in partial remission: Secondary | ICD-10-CM | POA: Diagnosis not present

## 2022-05-30 DIAGNOSIS — E1165 Type 2 diabetes mellitus with hyperglycemia: Secondary | ICD-10-CM | POA: Diagnosis not present

## 2022-05-31 DIAGNOSIS — L821 Other seborrheic keratosis: Secondary | ICD-10-CM | POA: Diagnosis not present

## 2022-05-31 DIAGNOSIS — L570 Actinic keratosis: Secondary | ICD-10-CM | POA: Diagnosis not present

## 2022-06-01 DIAGNOSIS — M542 Cervicalgia: Secondary | ICD-10-CM | POA: Diagnosis not present

## 2022-06-01 DIAGNOSIS — M47816 Spondylosis without myelopathy or radiculopathy, lumbar region: Secondary | ICD-10-CM | POA: Diagnosis not present

## 2022-06-02 NOTE — Telephone Encounter (Signed)
Inbound call from patient having issues with bowl habit changes. Please advise.

## 2022-06-15 ENCOUNTER — Ambulatory Visit: Payer: Medicare Other | Admitting: Physician Assistant

## 2022-06-16 DIAGNOSIS — E1165 Type 2 diabetes mellitus with hyperglycemia: Secondary | ICD-10-CM | POA: Diagnosis not present

## 2022-06-16 LAB — LAB REPORT - SCANNED: A1c: 7.2

## 2022-06-19 NOTE — Progress Notes (Unsigned)
06/20/2022 MARCELL HOCEVAR [id number] 03/18/1930  Referring provider: Buckner Malta, MD Primary GI doctor: Dr. Chales Abrahams  ASSESSMENT AND PLAN:   Gastroesophageal reflux disease, unspecified whether esophagitis present Patient with anxiety and CAD with angina No dysphagia, no weight loss He gets very concerned with chest pain that he is uncertain is GERD versus CAD, can be with or without exertion.  He is only on pepcid 10 mg twice a day due to fever or PPI, had bad reaction to prilosec with swelling Will send in pepcid 40 mg BID and if not helping, can consider nexium which daughter is on in addition.  Told to talk with cardiologist  Irritable bowel syndrome with constipation Now with diarrhea Pending stool studies with PCP however appears may have overflow diarrhea with stenosis, meds, decreased movement Will get Xray to evaluate stool burden Add on miralax twice daily, fiber and continue senokot every 2-3 days.   Asymptomatic cholelithiasis No RUQ pain Declines labs, had with PCP recently. Will try to get.  Coronary artery disease of native artery of native heart with stable angina pectoris (HCC) Severe CAD, not candidate for surgery Suggest follow up with cardiology, may benefit from imdur/ranexa if continuing chest pain  Paroxysmal A-fib (HCC) On medications  Close follow up.  Patient too high risk for endoscopic evaluation, can consider cross sectional imaging if any worsening symptoms.   ADDENDUM: - Labs from PCP 05/30/22 showed Iron 103, sat 34, FOBT negative, HGB 13.9, MCV 91, platlets 183, AST 18, ALT 14, alk phos 55, tbili 0.8, B12 248.   Patient Care Team: Buckner Malta, MD as PCP - General (Family Medicine) Thomasene Ripple, DO as PCP - Cardiology (Cardiology)  HISTORY OF PRESENT ILLNESS: 89 y.o. male with a past medical history of CAD (S/P recent cath), HTN, DM2, aortic stenosis (1.3 cm2 on 08/2021), spinal stenosis, anxiety/depression, A. Fib (Nl EF  45%-50% , No AC), HLD, IBS, asymptomatic cholelithiasis, nephrolithiasis, OA and others listed below presents for evaluation of change from constipation to diarrhea with incontinence.   Stents of cardiac evaluation and was found to have significant coronary artery disease, not a candidate for CABG, being medically managed.  He has appointment with Dr. Kirtland Bouchard for second opinion.  Negative cologuard 2020 and 2015.  He is in a wheel chair for long distances, walker at home, and cane occ wife is here with him and provides some of the history. He states worse thing he is dealing with at this time is spinal stenosis.   He states he has chest pain, he is uncertain if this is GERD or angina. He states he has been on pepcid 10 mg once a day, had a bad reaction for prilosec with swelling, never tried the protonix.  Daughter is on nexium and does well with it.  He has no dysphagia, no odyophagia. No nausea, no vomiting normally but had one episode last night.  He has diarrhea, comes and goes, has straining and then will have diarrhea. Can have urgency with it and can have water.  He has been on gas x, tums, mylanta.  Takes supplements over the counter.  Will take senokot every 3 rd and then on 4th day MOM. Never tried miralax.   Past GI procedures -Negative Cologuard 03/2013, 2020 -Multiple colonoscopies starting age 58 by Dr. Jennye Boroughs.  Had colonoscopy every 5 years.  Last colonoscopy at age 55.  No further colonoscopies needed per Dr. Jennye Boroughs.   Cardiac WU: Mild LVH, mildly reduced systolic function  mild GHK, EF 45 to 50%, RV/RA/LA mildly dilated, diffuse calcification of the AV, moderate AAS with AVA using continuity equation 1.3 cm, no AI, moderate MR, mild TR, RVSP 37, IVC mildly dilated, PAF, send SSS, anxiety, hypertension, hyperlipidemia and chest pain, three-vessel moderately noted coronary artery disease without surgical intervention (cardiac catheterization done 09/2021 shows: Three-vessel  obstructive CAD, heavy calcification of coronary arteries, only lesion which appears to be amenable to PCI as the proximal circumflex stenosis, LVEDP 19 mmHg).   He  reports that he has never smoked. He has never used smokeless tobacco. He reports that he does not drink alcohol and does not use drugs.  RELEVANT LABS AND IMAGING: CBC    Component Value Date/Time   WBC 6.3 10/10/2019 1011   RBC 4.25 10/10/2019 1011   HGB 13.5 10/10/2019 1011   HCT 40.9 10/10/2019 1011   PLT 175 10/10/2019 1011   MCV 96 10/10/2019 1011   MCH 31.8 10/10/2019 1011   MCHC 33.0 10/10/2019 1011   RDW 11.6 10/10/2019 1011   No results for input(s): "HGB" in the last 8760 hours.  CMP     Component Value Date/Time   NA 141 04/21/2022 1515   K 4.1 04/21/2022 1515   CL 102 04/21/2022 1515   CO2 23 04/21/2022 1515   GLUCOSE 104 (H) 04/21/2022 1515   BUN 18 04/21/2022 1515   CREATININE 0.73 (L) 04/21/2022 1515   CALCIUM 8.9 04/21/2022 1515   GFRNONAA 82 10/10/2019 1011   GFRAA 95 10/10/2019 1011       No data to display            Current Medications:    Current Outpatient Medications (Cardiovascular):    metoprolol tartrate (LOPRESSOR) 25 MG tablet, Take 12.5 mg by mouth 2 (two) times daily.   sacubitril-valsartan (ENTRESTO) 24-26 MG, Take 1 tablet by mouth 2 (two) times daily.   Current Outpatient Medications (Analgesics):    acetaminophen (TYLENOL) 500 MG tablet, Take 1,000 mg by mouth every 6 (six) hours as needed for mild pain. Rapid release   Current Outpatient Medications (Other):    ALPRAZolam (XANAX) 1 MG tablet, Take 1 mg by mouth 2 (two) times daily as needed for anxiety.   co-enzyme Q-10 30 MG capsule, Take 30 mg by mouth 3 (three) times daily.   COD LIVER OIL PO, Take 2 tablets by mouth every morning.   Elderberry 575 MG/5ML SYRP, Take 5 mLs by mouth daily. Extract   Hawthorne Berry 500 MG CAPS, Take 500 mg by mouth daily.   OVER THE COUNTER MEDICATION, Take 2 tablets by  mouth 3 (three) times daily with meals. Diaplex   OVER THE COUNTER MEDICATION, Take 1 tablet by mouth 2 (two) times daily with a meal. pancreatrophin pmg   OVER THE COUNTER MEDICATION, Take 1 tablet by mouth 3 (three) times daily with meals. livaplex   OVER THE COUNTER MEDICATION, Take 1-5 tablets by mouth See admin instructions. cataplex e2  Daily and need for tightness in chest or shortness of breath can chew up to 5 tables   Probiotic Product (PROBIOTIC PO), Take 1 tablet by mouth daily. 50 Billion   Taurine 1000 MG CAPS, Take 1,000 mg by mouth daily.   Ubiquinol 50 MG CAPS, Take 50 mg by mouth daily.  Medical History:  Past Medical History:  Diagnosis Date   Anxiety    Atrial fibrillation (HCC)    Chest pain in adult 12/07/2017   Colon polyps    Comprehensive  diabetic foot examination, type 2 DM, encounter for (HCC) 04/10/2019   Compression fracture of C-spine (HCC)    Contusion of right great toe without damage to nail 04/10/2019   Diabetic polyneuropathy associated with type 2 diabetes mellitus (HCC) 04/10/2019   Essential hypertension 12/07/2017   Hallux hammertoe, right 04/10/2019   IBS (irritable bowel syndrome)    Mixed hyperlipidemia 12/07/2017   Nonrheumatic aortic valve insufficiency 12/07/2017   Nonrheumatic mitral valve regurgitation 12/07/2017   Paroxysmal A-fib (HCC) 12/05/2017   PVC (premature ventricular contraction) 12/05/2017   Sick sinus syndrome (HCC) 12/07/2017   Spinal stenosis    Tinnitus    Allergies:  Allergies  Allergen Reactions   Amiodarone     Other reaction(s): Other (See Comments) Eye irritation   Erythromycin Base     Other reaction(s): Other (See Comments) Rapid heart rate   Omeprazole Other (See Comments)    Felt Bad! Pain & swelling in Left eye. Redness in face   Polycarbophil     Other reaction(s): Other (See Comments) Chest tightness   Penicillins Rash   Sulfa Antibiotics Rash     Surgical History:  He  has a past surgical  history that includes Hernia repair; Colonoscopy; and Cataract extraction (Bilateral, 2017). Family History:  His family history includes Breast cancer in his sister; CAD in his father; Coronary artery disease in his father; Diabetes in his maternal grandmother, mother, and sister; GI Bleed in his sister; Heart attack in his mother; Heart disease in his mother; Hyperlipidemia in his sister; Hypertension in his father; Lung cancer in his mother; Migraines in his child; Stroke in his father.  REVIEW OF SYSTEMS  : All other systems reviewed and negative except where noted in the History of Present Illness.  PHYSICAL EXAM: BP 130/68   Pulse 68   Ht 6' (1.829 m)   Wt 206 lb (93.4 kg)   BMI 27.94 kg/m  General Appearance: elderly appearing male with NAD Head:   Normocephalic and atraumatic. Eyes:  sclerae anicteric,conjunctive pink  Respiratory: Respiratory effort normal, BS equal bilaterally without rales, rhonchi, wheezing. Cardio: RRR with no MRGs. Peripheral pulses intact.  Abdomen: Soft,  Obese ,active bowel sounds. No tenderness . Without guarding and Without rebound. No masses. Rectal: Not evaluated Musculoskeletal: Full ROM, Not tested gait, in wheelchair. With edema. Skin:  Dry and intact without significant lesions or rashes Neuro: Alert and  oriented x4;  No focal deficits. Psych:  Cooperative. Normal mood and affect.    Doree Albee, PA-C 2:39 PM

## 2022-06-20 ENCOUNTER — Ambulatory Visit (INDEPENDENT_AMBULATORY_CARE_PROVIDER_SITE_OTHER)
Admission: RE | Admit: 2022-06-20 | Discharge: 2022-06-20 | Disposition: A | Discharge status: Home / Self Care | Payer: Medicare Other | Source: Ambulatory Visit | Attending: Physician Assistant | Admitting: Physician Assistant

## 2022-06-20 ENCOUNTER — Ambulatory Visit (INDEPENDENT_AMBULATORY_CARE_PROVIDER_SITE_OTHER): Payer: Medicare Other | Admitting: Physician Assistant

## 2022-06-20 ENCOUNTER — Encounter: Payer: Self-pay | Admitting: Physician Assistant

## 2022-06-20 VITALS — BP 130/68 | HR 68 | Ht 72.0 in | Wt 206.0 lb

## 2022-06-20 DIAGNOSIS — I25118 Atherosclerotic heart disease of native coronary artery with other forms of angina pectoris: Secondary | ICD-10-CM | POA: Diagnosis not present

## 2022-06-20 DIAGNOSIS — I480 Paroxysmal atrial fibrillation: Secondary | ICD-10-CM

## 2022-06-20 DIAGNOSIS — K581 Irritable bowel syndrome with constipation: Secondary | ICD-10-CM | POA: Diagnosis not present

## 2022-06-20 DIAGNOSIS — K219 Gastro-esophageal reflux disease without esophagitis: Secondary | ICD-10-CM

## 2022-06-20 DIAGNOSIS — K8020 Calculus of gallbladder without cholecystitis without obstruction: Secondary | ICD-10-CM

## 2022-06-20 DIAGNOSIS — K3189 Other diseases of stomach and duodenum: Secondary | ICD-10-CM | POA: Diagnosis not present

## 2022-06-20 MED ORDER — FAMOTIDINE 40 MG PO TABS: 40.0000 mg | ORAL_TABLET | Freq: Two times a day (BID) | ORAL | 2 refills | Status: DC

## 2022-06-20 NOTE — Patient Instructions (Addendum)
Your provider has requested that you have an abdominal x ray before leaving today. Please go to the basement floor to our Radiology department for the test.  We have scheduled you for a follow up with Quentin Mulling on 10/06/2022 at 10:00am   Will increase the pepcid to 40 mg twice a day, if this is not helping we can add on nexium 20 mg with the pepcid at night.  Avoid spicy and acidic foods Avoid fatty foods Limit your intake of coffee, tea, alcohol, and carbonated drinks Work to maintain a healthy weight Keep the head of the bed elevated at least 3 inches with blocks or a wedge pillow if you are having any nighttime symptoms Stay upright for 2 hours after eating Avoid meals and snacks three to four hours before bedtime  I believe your loose stools may be due to something called overflow diarrhea. If you predominantly have constipation, straining or incomplete bowel movements at times the only thing to get through the large amounts of stool is liquid. This can cause someone to feel like they are having diarrhea yet actually they have too much backup of stool. We can evaluate this with a rectal exam, and x-ray of your abdomen, or CT scan. If this is the case usually doing a combination of Benefiber 1-2 times daily with MiraLAX 17 g daily can be helpful. At times there are medications that can also be helpful for this but generally I start with over-the-counter I also like somebody to get a squatty potty to help straighten out the colon whether having a bowel movement. Sometimes we will schedule for pelvic floor physical therapy if you are having incomplete bowel movements and failure pelvic floor is weak and contributing to the constipation.   Benefiber or Citracel is good for constipation/diarrhea/irritable bowel syndrome, it helps with weight loss and can help lower your bad cholesterol. Please do 1 TBSP in the morning in water, coffee, or tea up to twice a day. It can take up to a month  before you can see a difference with your bowel movements. It is cheapest from [employer], [employer], [employer].   Miralax is an osmotic laxative.  It only brings more water into the stool.  This is safe to take daily.  Take 17 gram of miralax twice a day.  Mix with juice or coffee.  Can take  3-4 days and reassess your response in 3-4 days.  You can increase and decrease the dose based on your response.  Remember, it can take up to 3-4 days to take effect OR for the effects to wear off.   I often pair this with benefiber in the morning to help assure the stool is not too loose.  Every 2nd or 3rd night take senokot for 2-4 weeks then can try as needed  Still do stool cultures with PCP   _______________________________________________________  If your blood pressure at your visit was 140/90 or greater, please contact your primary care physician to follow up on this.  _______________________________________________________  If you are age 87 or older, your body mass index should be between 23-30. Your Body mass index is 27.94 kg/m. If this is out of the aforementioned range listed, please consider follow up with your Primary Care Provider.  If you are age 87 or younger, your body mass index should be between 19-25. Your Body mass index is 27.94 kg/m. If this is out of the aformentioned range listed, please consider follow up with your Primary Care Provider.  ________________________________________________________  The [facility] providers would like to encourage you to use [facility] to communicate with providers for non-urgent requests or questions.  Due to long hold times on the telephone, sending your provider a message by [facility] may be a faster and more efficient way to get a response.  Please allow 48 business hours for a response.  Please remember that this is for non-urgent requests.  _______________________________________________________ It was a pleasure to see you today!  Thank you  for trusting me with your gastrointestinal care!

## 2022-06-24 DIAGNOSIS — I700 Atherosclerosis of aorta: Secondary | ICD-10-CM | POA: Diagnosis not present

## 2022-06-24 DIAGNOSIS — R0902 Hypoxemia: Secondary | ICD-10-CM | POA: Diagnosis not present

## 2022-06-24 DIAGNOSIS — R0602 Shortness of breath: Secondary | ICD-10-CM | POA: Diagnosis not present

## 2022-06-24 DIAGNOSIS — I4891 Unspecified atrial fibrillation: Secondary | ICD-10-CM | POA: Diagnosis not present

## 2022-06-24 DIAGNOSIS — J9690 Respiratory failure, unspecified, unspecified whether with hypoxia or hypercapnia: Secondary | ICD-10-CM | POA: Diagnosis not present

## 2022-06-24 DIAGNOSIS — M1990 Unspecified osteoarthritis, unspecified site: Secondary | ICD-10-CM | POA: Diagnosis not present

## 2022-06-24 DIAGNOSIS — E119 Type 2 diabetes mellitus without complications: Secondary | ICD-10-CM | POA: Diagnosis not present

## 2022-06-24 DIAGNOSIS — Z7982 Long term (current) use of aspirin: Secondary | ICD-10-CM | POA: Diagnosis not present

## 2022-06-24 DIAGNOSIS — I350 Nonrheumatic aortic (valve) stenosis: Secondary | ICD-10-CM | POA: Diagnosis not present

## 2022-06-24 DIAGNOSIS — Z888 Allergy status to other drugs, medicaments and biological substances status: Secondary | ICD-10-CM | POA: Diagnosis not present

## 2022-06-24 DIAGNOSIS — I214 Non-ST elevation (NSTEMI) myocardial infarction: Secondary | ICD-10-CM | POA: Diagnosis not present

## 2022-06-24 DIAGNOSIS — Z79899 Other long term (current) drug therapy: Secondary | ICD-10-CM | POA: Diagnosis not present

## 2022-06-24 DIAGNOSIS — I499 Cardiac arrhythmia, unspecified: Secondary | ICD-10-CM | POA: Diagnosis not present

## 2022-06-24 DIAGNOSIS — I480 Paroxysmal atrial fibrillation: Secondary | ICD-10-CM | POA: Diagnosis not present

## 2022-06-24 DIAGNOSIS — I739 Peripheral vascular disease, unspecified: Secondary | ICD-10-CM | POA: Diagnosis not present

## 2022-06-24 DIAGNOSIS — Z880 Allergy status to penicillin: Secondary | ICD-10-CM | POA: Diagnosis not present

## 2022-06-24 DIAGNOSIS — I509 Heart failure, unspecified: Secondary | ICD-10-CM | POA: Diagnosis not present

## 2022-06-24 DIAGNOSIS — M47816 Spondylosis without myelopathy or radiculopathy, lumbar region: Secondary | ICD-10-CM | POA: Diagnosis not present

## 2022-06-24 DIAGNOSIS — R6889 Other general symptoms and signs: Secondary | ICD-10-CM | POA: Diagnosis not present

## 2022-06-24 DIAGNOSIS — I340 Nonrheumatic mitral (valve) insufficiency: Secondary | ICD-10-CM | POA: Diagnosis not present

## 2022-06-24 DIAGNOSIS — Z7902 Long term (current) use of antithrombotics/antiplatelets: Secondary | ICD-10-CM | POA: Diagnosis not present

## 2022-06-24 DIAGNOSIS — Z66 Do not resuscitate: Secondary | ICD-10-CM | POA: Diagnosis not present

## 2022-06-24 DIAGNOSIS — R9431 Abnormal electrocardiogram [ECG] [EKG]: Secondary | ICD-10-CM | POA: Diagnosis not present

## 2022-06-24 DIAGNOSIS — R062 Wheezing: Secondary | ICD-10-CM | POA: Diagnosis not present

## 2022-06-24 DIAGNOSIS — E440 Moderate protein-calorie malnutrition: Secondary | ICD-10-CM | POA: Diagnosis not present

## 2022-06-24 DIAGNOSIS — J9601 Acute respiratory failure with hypoxia: Secondary | ICD-10-CM | POA: Diagnosis not present

## 2022-06-24 DIAGNOSIS — K219 Gastro-esophageal reflux disease without esophagitis: Secondary | ICD-10-CM | POA: Diagnosis not present

## 2022-06-24 DIAGNOSIS — I110 Hypertensive heart disease with heart failure: Secondary | ICD-10-CM | POA: Diagnosis not present

## 2022-06-24 DIAGNOSIS — R2681 Unsteadiness on feet: Secondary | ICD-10-CM | POA: Diagnosis not present

## 2022-06-24 DIAGNOSIS — Z794 Long term (current) use of insulin: Secondary | ICD-10-CM | POA: Diagnosis not present

## 2022-06-24 DIAGNOSIS — I10 Essential (primary) hypertension: Secondary | ICD-10-CM | POA: Diagnosis not present

## 2022-06-24 DIAGNOSIS — I5023 Acute on chronic systolic (congestive) heart failure: Secondary | ICD-10-CM | POA: Diagnosis not present

## 2022-06-24 DIAGNOSIS — I472 Ventricular tachycardia, unspecified: Secondary | ICD-10-CM | POA: Diagnosis not present

## 2022-06-24 DIAGNOSIS — I482 Chronic atrial fibrillation, unspecified: Secondary | ICD-10-CM | POA: Diagnosis not present

## 2022-06-24 DIAGNOSIS — R079 Chest pain, unspecified: Secondary | ICD-10-CM | POA: Diagnosis not present

## 2022-06-24 DIAGNOSIS — M4800 Spinal stenosis, site unspecified: Secondary | ICD-10-CM | POA: Diagnosis not present

## 2022-06-24 DIAGNOSIS — E785 Hyperlipidemia, unspecified: Secondary | ICD-10-CM | POA: Diagnosis not present

## 2022-06-24 DIAGNOSIS — Z79891 Long term (current) use of opiate analgesic: Secondary | ICD-10-CM | POA: Diagnosis not present

## 2022-06-24 DIAGNOSIS — Z882 Allergy status to sulfonamides status: Secondary | ICD-10-CM | POA: Diagnosis not present

## 2022-06-24 DIAGNOSIS — J810 Acute pulmonary edema: Secondary | ICD-10-CM | POA: Diagnosis not present

## 2022-06-24 DIAGNOSIS — Z743 Need for continuous supervision: Secondary | ICD-10-CM | POA: Diagnosis not present

## 2022-06-24 DIAGNOSIS — I2510 Atherosclerotic heart disease of native coronary artery without angina pectoris: Secondary | ICD-10-CM | POA: Diagnosis not present

## 2022-06-24 DIAGNOSIS — J9811 Atelectasis: Secondary | ICD-10-CM | POA: Diagnosis not present

## 2022-06-24 DIAGNOSIS — F32A Depression, unspecified: Secondary | ICD-10-CM | POA: Diagnosis not present

## 2022-06-24 DIAGNOSIS — F419 Anxiety disorder, unspecified: Secondary | ICD-10-CM | POA: Diagnosis not present

## 2022-06-24 DIAGNOSIS — M6281 Muscle weakness (generalized): Secondary | ICD-10-CM | POA: Diagnosis not present

## 2022-06-24 DIAGNOSIS — I252 Old myocardial infarction: Secondary | ICD-10-CM | POA: Diagnosis not present

## 2022-06-24 DIAGNOSIS — I352 Nonrheumatic aortic (valve) stenosis with insufficiency: Secondary | ICD-10-CM | POA: Diagnosis not present

## 2022-06-24 DIAGNOSIS — R2689 Other abnormalities of gait and mobility: Secondary | ICD-10-CM | POA: Diagnosis not present

## 2022-06-24 DIAGNOSIS — R54 Age-related physical debility: Secondary | ICD-10-CM | POA: Diagnosis not present

## 2022-06-25 DIAGNOSIS — I214 Non-ST elevation (NSTEMI) myocardial infarction: Secondary | ICD-10-CM | POA: Diagnosis not present

## 2022-06-25 DIAGNOSIS — I4891 Unspecified atrial fibrillation: Secondary | ICD-10-CM | POA: Diagnosis not present

## 2022-06-25 DIAGNOSIS — I10 Essential (primary) hypertension: Secondary | ICD-10-CM | POA: Diagnosis not present

## 2022-06-26 DIAGNOSIS — E785 Hyperlipidemia, unspecified: Secondary | ICD-10-CM | POA: Diagnosis not present

## 2022-06-26 DIAGNOSIS — I4891 Unspecified atrial fibrillation: Secondary | ICD-10-CM | POA: Diagnosis not present

## 2022-06-26 DIAGNOSIS — I10 Essential (primary) hypertension: Secondary | ICD-10-CM | POA: Diagnosis not present

## 2022-06-26 DIAGNOSIS — I509 Heart failure, unspecified: Secondary | ICD-10-CM | POA: Diagnosis not present

## 2022-06-26 DIAGNOSIS — I214 Non-ST elevation (NSTEMI) myocardial infarction: Secondary | ICD-10-CM | POA: Diagnosis not present

## 2022-06-27 DIAGNOSIS — I4891 Unspecified atrial fibrillation: Secondary | ICD-10-CM | POA: Diagnosis not present

## 2022-06-27 DIAGNOSIS — I10 Essential (primary) hypertension: Secondary | ICD-10-CM | POA: Diagnosis not present

## 2022-06-27 DIAGNOSIS — E785 Hyperlipidemia, unspecified: Secondary | ICD-10-CM | POA: Diagnosis not present

## 2022-06-27 DIAGNOSIS — I214 Non-ST elevation (NSTEMI) myocardial infarction: Secondary | ICD-10-CM | POA: Diagnosis not present

## 2022-06-27 DIAGNOSIS — I509 Heart failure, unspecified: Secondary | ICD-10-CM | POA: Diagnosis not present

## 2022-06-27 NOTE — Progress Notes (Signed)
Agree with assessment/plan.  Raj Makaelah Cranfield, MD [facility] [phone]  

## 2022-06-28 ENCOUNTER — Telehealth: Payer: Self-pay | Admitting: Cardiology

## 2022-06-28 DIAGNOSIS — I509 Heart failure, unspecified: Secondary | ICD-10-CM | POA: Diagnosis not present

## 2022-06-28 DIAGNOSIS — E440 Moderate protein-calorie malnutrition: Secondary | ICD-10-CM | POA: Diagnosis not present

## 2022-06-28 DIAGNOSIS — I252 Old myocardial infarction: Secondary | ICD-10-CM | POA: Diagnosis not present

## 2022-06-28 DIAGNOSIS — I482 Chronic atrial fibrillation, unspecified: Secondary | ICD-10-CM | POA: Diagnosis not present

## 2022-06-28 DIAGNOSIS — E785 Hyperlipidemia, unspecified: Secondary | ICD-10-CM | POA: Diagnosis not present

## 2022-06-28 DIAGNOSIS — E119 Type 2 diabetes mellitus without complications: Secondary | ICD-10-CM | POA: Diagnosis not present

## 2022-06-28 DIAGNOSIS — I2510 Atherosclerotic heart disease of native coronary artery without angina pectoris: Secondary | ICD-10-CM | POA: Diagnosis not present

## 2022-06-28 DIAGNOSIS — F419 Anxiety disorder, unspecified: Secondary | ICD-10-CM | POA: Diagnosis not present

## 2022-06-28 DIAGNOSIS — Z743 Need for continuous supervision: Secondary | ICD-10-CM | POA: Diagnosis not present

## 2022-06-28 DIAGNOSIS — R2681 Unsteadiness on feet: Secondary | ICD-10-CM | POA: Diagnosis not present

## 2022-06-28 DIAGNOSIS — M6281 Muscle weakness (generalized): Secondary | ICD-10-CM | POA: Diagnosis not present

## 2022-06-28 DIAGNOSIS — M4800 Spinal stenosis, site unspecified: Secondary | ICD-10-CM | POA: Diagnosis not present

## 2022-06-28 DIAGNOSIS — J9601 Acute respiratory failure with hypoxia: Secondary | ICD-10-CM | POA: Diagnosis not present

## 2022-06-28 DIAGNOSIS — J9690 Respiratory failure, unspecified, unspecified whether with hypoxia or hypercapnia: Secondary | ICD-10-CM | POA: Diagnosis not present

## 2022-06-28 DIAGNOSIS — I739 Peripheral vascular disease, unspecified: Secondary | ICD-10-CM | POA: Diagnosis not present

## 2022-06-28 DIAGNOSIS — R2689 Other abnormalities of gait and mobility: Secondary | ICD-10-CM | POA: Diagnosis not present

## 2022-06-28 DIAGNOSIS — R262 Difficulty in walking, not elsewhere classified: Secondary | ICD-10-CM | POA: Diagnosis not present

## 2022-06-28 DIAGNOSIS — R54 Age-related physical debility: Secondary | ICD-10-CM | POA: Diagnosis not present

## 2022-06-28 DIAGNOSIS — J810 Acute pulmonary edema: Secondary | ICD-10-CM | POA: Diagnosis not present

## 2022-06-28 DIAGNOSIS — I4891 Unspecified atrial fibrillation: Secondary | ICD-10-CM | POA: Diagnosis not present

## 2022-06-28 DIAGNOSIS — F32A Depression, unspecified: Secondary | ICD-10-CM | POA: Diagnosis not present

## 2022-06-28 DIAGNOSIS — I10 Essential (primary) hypertension: Secondary | ICD-10-CM | POA: Diagnosis not present

## 2022-06-28 DIAGNOSIS — I5023 Acute on chronic systolic (congestive) heart failure: Secondary | ICD-10-CM | POA: Diagnosis not present

## 2022-06-28 DIAGNOSIS — I214 Non-ST elevation (NSTEMI) myocardial infarction: Secondary | ICD-10-CM | POA: Diagnosis not present

## 2022-06-28 DIAGNOSIS — E1151 Type 2 diabetes mellitus with diabetic peripheral angiopathy without gangrene: Secondary | ICD-10-CM | POA: Diagnosis not present

## 2022-06-28 DIAGNOSIS — K219 Gastro-esophageal reflux disease without esophagitis: Secondary | ICD-10-CM | POA: Diagnosis not present

## 2022-06-28 DIAGNOSIS — I110 Hypertensive heart disease with heart failure: Secondary | ICD-10-CM | POA: Diagnosis not present

## 2022-06-28 NOTE — Telephone Encounter (Signed)
Pt spouse called in wanting to inform Dr. Bing Matter that pt was in [facility] and had a "mild heart atttack" and he is now in [facility]

## 2022-06-30 ENCOUNTER — Telehealth: Payer: Self-pay | Admitting: Cardiology

## 2022-06-30 DIAGNOSIS — I5023 Acute on chronic systolic (congestive) heart failure: Secondary | ICD-10-CM | POA: Diagnosis not present

## 2022-06-30 DIAGNOSIS — I2510 Atherosclerotic heart disease of native coronary artery without angina pectoris: Secondary | ICD-10-CM | POA: Diagnosis not present

## 2022-06-30 DIAGNOSIS — E1151 Type 2 diabetes mellitus with diabetic peripheral angiopathy without gangrene: Secondary | ICD-10-CM | POA: Diagnosis not present

## 2022-06-30 DIAGNOSIS — R262 Difficulty in walking, not elsewhere classified: Secondary | ICD-10-CM | POA: Diagnosis not present

## 2022-06-30 NOTE — Telephone Encounter (Signed)
New Messasge:      Patient says he is in [facility] for [facility]. He has Spinal Stenosis. He says when dDr Bing Matter have time, he would like to talk to him please. o

## 2022-06-30 NOTE — Telephone Encounter (Signed)
Spoke with pt, he was discharged from [facility] on 06/28/22 and needs a follow up appointment. Patient is already scheduled to be seen 07/18/22 and he will keep that appointment.

## 2022-07-05 NOTE — Telephone Encounter (Signed)
Left message for the patient to call back.

## 2022-07-06 NOTE — Telephone Encounter (Signed)
Left message for the patient to call back.

## 2022-07-17 NOTE — Telephone Encounter (Signed)
LVM to call regarding message. 

## 2022-07-17 NOTE — Telephone Encounter (Signed)
Pt would like a callback from nurses regarding Heartburn and medications. Please advise

## 2022-07-17 DEATH — deceased

## 2022-07-18 ENCOUNTER — Ambulatory Visit: Payer: Medicare Other | Admitting: Gastroenterology

## 2022-07-21 ENCOUNTER — Ambulatory Visit: Payer: Medicare Other | Admitting: Cardiology

## 2022-10-06 ENCOUNTER — Ambulatory Visit: Payer: Medicare Other | Admitting: Physician Assistant
